# Patient Record
Sex: Female | Born: 1955
Health system: Southern US, Community
[De-identification: ages and names within clinical notes are randomized; demographics above are authoritative.]

## PROBLEM LIST (undated history)

## (undated) DIAGNOSIS — Q211 Atrial septal defect: Secondary | ICD-10-CM

## (undated) DIAGNOSIS — Z780 Asymptomatic menopausal state: Secondary | ICD-10-CM

## (undated) DIAGNOSIS — R112 Nausea with vomiting, unspecified: Secondary | ICD-10-CM

## (undated) DIAGNOSIS — N200 Calculus of kidney: Secondary | ICD-10-CM

## (undated) DIAGNOSIS — K5790 Diverticulosis of intestine, part unspecified, without perforation or abscess without bleeding: Secondary | ICD-10-CM

## (undated) DIAGNOSIS — Z9889 Other specified postprocedural states: Secondary | ICD-10-CM

## (undated) DIAGNOSIS — IMO0002 Reserved for concepts with insufficient information to code with codable children: Secondary | ICD-10-CM

## (undated) DIAGNOSIS — Q2112 Patent foramen ovale: Secondary | ICD-10-CM

## (undated) DIAGNOSIS — I639 Cerebral infarction, unspecified: Secondary | ICD-10-CM

## (undated) DIAGNOSIS — J302 Other seasonal allergic rhinitis: Secondary | ICD-10-CM

## (undated) DIAGNOSIS — I10 Essential (primary) hypertension: Secondary | ICD-10-CM

## (undated) DIAGNOSIS — C801 Malignant (primary) neoplasm, unspecified: Secondary | ICD-10-CM

## (undated) DIAGNOSIS — Z8669 Personal history of other diseases of the nervous system and sense organs: Secondary | ICD-10-CM

## (undated) HISTORY — PX: TOTAL ABDOMINAL HYSTERECTOMY: SHX209

## (undated) HISTORY — DX: Patent foramen ovale: Q21.12

## (undated) HISTORY — DX: Reserved for concepts with insufficient information to code with codable children: IMO0002

## (undated) HISTORY — DX: Malignant (primary) neoplasm, unspecified: C80.1

## (undated) HISTORY — DX: Cerebral infarction, unspecified: I63.9

## (undated) HISTORY — DX: Asymptomatic menopausal state: Z78.0

## (undated) HISTORY — DX: Other seasonal allergic rhinitis: J30.2

## (undated) HISTORY — DX: Atrial septal defect: Q21.1

## (undated) HISTORY — DX: Diverticulosis of intestine, part unspecified, without perforation or abscess without bleeding: K57.90

## (undated) HISTORY — DX: Personal history of other diseases of the nervous system and sense organs: Z86.69

## (undated) HISTORY — DX: Calculus of kidney: N20.0

---

## 1985-07-19 HISTORY — PX: BUNIONECTOMY: SHX129

## 1986-07-19 HISTORY — PX: TUBAL LIGATION: SHX77

## 1999-06-23 ENCOUNTER — Other Ambulatory Visit: Admission: RE | Admit: 1999-06-23 | Discharge: 1999-06-23 | Payer: Self-pay | Admitting: Obstetrics & Gynecology

## 1999-11-25 ENCOUNTER — Ambulatory Visit (HOSPITAL_COMMUNITY): Admission: RE | Admit: 1999-11-25 | Discharge: 1999-11-25 | Payer: Self-pay | Admitting: Neurology

## 1999-11-25 ENCOUNTER — Encounter: Payer: Self-pay | Admitting: Neurology

## 1999-12-03 ENCOUNTER — Ambulatory Visit: Admission: RE | Admit: 1999-12-03 | Discharge: 1999-12-03 | Payer: Self-pay | Admitting: Anesthesiology

## 2000-12-30 ENCOUNTER — Other Ambulatory Visit: Admission: RE | Admit: 2000-12-30 | Discharge: 2000-12-30 | Payer: Self-pay | Admitting: Obstetrics & Gynecology

## 2001-01-10 ENCOUNTER — Ambulatory Visit (HOSPITAL_COMMUNITY): Admission: RE | Admit: 2001-01-10 | Discharge: 2001-01-10 | Payer: Self-pay | Admitting: *Deleted

## 2001-01-10 ENCOUNTER — Encounter (INDEPENDENT_AMBULATORY_CARE_PROVIDER_SITE_OTHER): Payer: Self-pay | Admitting: *Deleted

## 2002-03-08 ENCOUNTER — Other Ambulatory Visit: Admission: RE | Admit: 2002-03-08 | Discharge: 2002-03-08 | Payer: Self-pay | Admitting: Otolaryngology

## 2003-11-19 ENCOUNTER — Other Ambulatory Visit: Admission: RE | Admit: 2003-11-19 | Discharge: 2003-11-19 | Payer: Self-pay | Admitting: Obstetrics & Gynecology

## 2005-08-19 DIAGNOSIS — IMO0002 Reserved for concepts with insufficient information to code with codable children: Secondary | ICD-10-CM

## 2005-08-19 DIAGNOSIS — R87619 Unspecified abnormal cytological findings in specimens from cervix uteri: Secondary | ICD-10-CM

## 2005-08-19 HISTORY — DX: Reserved for concepts with insufficient information to code with codable children: IMO0002

## 2005-08-19 HISTORY — DX: Unspecified abnormal cytological findings in specimens from cervix uteri: R87.619

## 2005-09-13 ENCOUNTER — Other Ambulatory Visit: Admission: RE | Admit: 2005-09-13 | Discharge: 2005-09-13 | Payer: Self-pay | Admitting: Obstetrics & Gynecology

## 2005-10-08 ENCOUNTER — Encounter (INDEPENDENT_AMBULATORY_CARE_PROVIDER_SITE_OTHER): Payer: Self-pay | Admitting: *Deleted

## 2005-10-08 ENCOUNTER — Inpatient Hospital Stay (HOSPITAL_COMMUNITY): Admission: AD | Admit: 2005-10-08 | Discharge: 2005-10-09 | Payer: Self-pay | Admitting: Obstetrics and Gynecology

## 2005-10-08 ENCOUNTER — Ambulatory Visit (HOSPITAL_COMMUNITY): Admission: RE | Admit: 2005-10-08 | Discharge: 2005-10-08 | Payer: Self-pay | Admitting: Obstetrics & Gynecology

## 2005-10-15 ENCOUNTER — Ambulatory Visit: Admission: RE | Admit: 2005-10-15 | Discharge: 2005-10-15 | Payer: Self-pay | Admitting: Gynecology

## 2005-12-07 ENCOUNTER — Ambulatory Visit: Admission: RE | Admit: 2005-12-07 | Discharge: 2005-12-07 | Payer: Self-pay | Admitting: Gynecology

## 2006-01-20 ENCOUNTER — Ambulatory Visit (HOSPITAL_COMMUNITY): Admission: RE | Admit: 2006-01-20 | Discharge: 2006-01-20 | Payer: Self-pay | Admitting: Gynecology

## 2006-02-22 ENCOUNTER — Encounter (INDEPENDENT_AMBULATORY_CARE_PROVIDER_SITE_OTHER): Payer: Self-pay | Admitting: *Deleted

## 2006-02-22 ENCOUNTER — Ambulatory Visit: Admission: RE | Admit: 2006-02-22 | Discharge: 2006-02-22 | Payer: Self-pay | Admitting: Gynecology

## 2006-02-22 ENCOUNTER — Other Ambulatory Visit: Admission: RE | Admit: 2006-02-22 | Discharge: 2006-02-22 | Payer: Self-pay | Admitting: Gynecology

## 2006-07-19 HISTORY — PX: OTHER SURGICAL HISTORY: SHX169

## 2006-09-16 ENCOUNTER — Encounter (INDEPENDENT_AMBULATORY_CARE_PROVIDER_SITE_OTHER): Payer: Self-pay | Admitting: Specialist

## 2006-09-16 ENCOUNTER — Ambulatory Visit: Admission: RE | Admit: 2006-09-16 | Discharge: 2006-09-16 | Payer: Self-pay | Admitting: Gynecology

## 2006-09-16 ENCOUNTER — Other Ambulatory Visit: Admission: RE | Admit: 2006-09-16 | Discharge: 2006-09-16 | Payer: Self-pay | Admitting: Gynecology

## 2007-02-16 ENCOUNTER — Encounter: Admission: RE | Admit: 2007-02-16 | Discharge: 2007-02-16 | Payer: Self-pay | Admitting: Family Medicine

## 2007-03-23 ENCOUNTER — Encounter: Admission: RE | Admit: 2007-03-23 | Discharge: 2007-03-23 | Payer: Self-pay | Admitting: Surgery

## 2007-03-23 ENCOUNTER — Ambulatory Visit (HOSPITAL_BASED_OUTPATIENT_CLINIC_OR_DEPARTMENT_OTHER): Admission: RE | Admit: 2007-03-23 | Discharge: 2007-03-23 | Payer: Self-pay | Admitting: Surgery

## 2007-03-23 ENCOUNTER — Encounter (INDEPENDENT_AMBULATORY_CARE_PROVIDER_SITE_OTHER): Payer: Self-pay | Admitting: Surgery

## 2007-04-07 ENCOUNTER — Other Ambulatory Visit: Admission: RE | Admit: 2007-04-07 | Discharge: 2007-04-07 | Payer: Self-pay | Admitting: Gynecology

## 2007-04-07 ENCOUNTER — Ambulatory Visit: Admission: RE | Admit: 2007-04-07 | Discharge: 2007-04-07 | Payer: Self-pay | Admitting: Gynecology

## 2007-04-07 ENCOUNTER — Encounter: Payer: Self-pay | Admitting: Gynecology

## 2007-04-27 ENCOUNTER — Encounter: Admission: RE | Admit: 2007-04-27 | Discharge: 2007-04-27 | Payer: Self-pay | Admitting: Gastroenterology

## 2007-11-22 ENCOUNTER — Encounter: Payer: Self-pay | Admitting: Gynecology

## 2007-11-22 ENCOUNTER — Ambulatory Visit: Admission: RE | Admit: 2007-11-22 | Discharge: 2007-11-22 | Payer: Self-pay | Admitting: Gynecology

## 2007-11-22 ENCOUNTER — Other Ambulatory Visit: Admission: RE | Admit: 2007-11-22 | Discharge: 2007-11-22 | Payer: Self-pay | Admitting: Gynecology

## 2007-12-28 ENCOUNTER — Ambulatory Visit: Payer: Self-pay | Admitting: Nurse Practitioner

## 2008-01-31 ENCOUNTER — Encounter: Admission: RE | Admit: 2008-01-31 | Discharge: 2008-01-31 | Payer: Self-pay | Admitting: Obstetrics & Gynecology

## 2008-02-28 HISTORY — PX: NM MYOCAR PERF WALL MOTION: HXRAD629

## 2008-03-21 HISTORY — PX: US ECHOCARDIOGRAPHY: HXRAD669

## 2008-04-11 ENCOUNTER — Ambulatory Visit: Payer: Self-pay | Admitting: Nurse Practitioner

## 2008-12-10 ENCOUNTER — Ambulatory Visit: Payer: Self-pay | Admitting: Nurse Practitioner

## 2008-12-13 ENCOUNTER — Encounter: Payer: Self-pay | Admitting: Gynecology

## 2008-12-13 ENCOUNTER — Other Ambulatory Visit: Admission: RE | Admit: 2008-12-13 | Discharge: 2008-12-13 | Payer: Self-pay | Admitting: Gynecology

## 2008-12-13 ENCOUNTER — Ambulatory Visit: Admission: RE | Admit: 2008-12-13 | Discharge: 2008-12-13 | Payer: Self-pay | Admitting: Gynecology

## 2009-01-31 ENCOUNTER — Encounter: Admission: RE | Admit: 2009-01-31 | Discharge: 2009-01-31 | Payer: Self-pay | Admitting: Obstetrics & Gynecology

## 2009-12-05 ENCOUNTER — Other Ambulatory Visit: Admission: RE | Admit: 2009-12-05 | Discharge: 2009-12-05 | Payer: Self-pay | Admitting: Gynecology

## 2009-12-05 ENCOUNTER — Ambulatory Visit: Admission: RE | Admit: 2009-12-05 | Discharge: 2009-12-05 | Payer: Self-pay | Admitting: Gynecology

## 2010-03-17 ENCOUNTER — Ambulatory Visit: Payer: Self-pay | Admitting: Nurse Practitioner

## 2010-04-12 ENCOUNTER — Encounter: Admission: RE | Admit: 2010-04-12 | Discharge: 2010-04-12 | Payer: Self-pay | Admitting: Orthopaedic Surgery

## 2010-07-19 DIAGNOSIS — N200 Calculus of kidney: Secondary | ICD-10-CM

## 2010-07-19 HISTORY — DX: Calculus of kidney: N20.0

## 2010-12-01 NOTE — Consult Note (Signed)
Raven [name], STROTHMAN NO.:  [id number]   MEDICAL RECORD NO.:  [id number]          PATIENT TYPE:  OUT   LOCATION:  GYN                          FACILITY:  [facility]   PHYSICIAN:  De Blanch, M.D.DATE OF BIRTH:  1955/11/09   DATE OF CONSULTATION:  12/13/2008  DATE OF DISCHARGE:                                 CONSULTATION   CHIEF COMPLAINT:  Carcinoma of the cervix.   INTERVAL HISTORY:  The patient returns today for continuing followup of  adenocarcinoma of the cervix treated with radical hysterectomy April  2007.  Since her last visit, she has done well.  She denies any GI/GU  symptoms.  Has no pelvic pain, pressure, vaginal bleeding or discharge.  It is noted that her last Pap smear showed a low-grade squamous  intraepithelial lesion.  She denies any vaginal bleeding.  She notes  that her bladder function is good and functional status is excellent.   HISTORY OF PRESENT ILLNESS:  April 2007, radical hysterectomy and pelvic  lymphadenectomy, and suprapubic catheter placement for a stage IB grade  1 adenocarcinoma of the cervix.  Final pathology showed no residual  disease.  The patient had 33 negative lymph nodes.  She did not receive  any adjuvant therapy.   PAST MEDICAL HISTORY/ILLNESSES:  1. Migraine headaches.  2. Acid reflux.  3. Diverticulosis.   PAST SURGICAL HISTORY:  1. Bilateral bunionectomy.  2. Tubal ligation.  3. Resection of precancerous lesions from the tongue.  4. Cold knife conization.  5. Radical hysterectomy and pelvic lymphadenectomy.  6. Breast biopsy.   DRUG ALLERGIES:  1. SULFA.  2. CODEINE.   CURRENT MEDICATIONS:  1. Nexium.  2. Imitrex.  3. Premarin 1.25 mg daily.  4. Zyrtec.  5. Topamax.  6. Calcium with vitamin D.   FAMILY HISTORY:  Negative for gynecologic, breast or colon cancer.   OBSTETRICAL HISTORY:  Gravida 3.   SOCIAL HISTORY:  The patient is married.  She does not drink.   REVIEW OF SYSTEMS:  A  10-point comprehensive review of systems is  negative except as noted above.   PHYSICAL EXAMINATION:  VITAL SIGNS:  Weight 170 pounds.  GENERAL:  The patient is a healthy white female in no acute distress.  HEENT:  Negative.  NECK:  Supple without thyromegaly.  There is no supraclavicular or  inguinal adenopathy.  ABDOMEN:  Soft, nontender.  No mass, organomegaly, ascites or hernias  noted.  Pfannenstiel incision is well healed.  PELVIC:  EGBUS, vagina, bladder and urethra are normal.  Cervix and  uterus are surgically absent.  Adnexa without masses.  Rectovaginal exam  confirms.  LOWER EXTREMITIES:  Without edema or varicosities.   IMPRESSION:  Stage IB1 adenocarcinoma of the cervix.  No evidence of  recurrent disease.  Pap smears were obtained.   PLAN:  The patient will return to see Dr. Arlyce Dice in 6 months.  Return to  see [place] in 1 year.      De Blanch, M.D.  Electronically Signed     DC/MEDQ  D:  12/13/2008  T:  12/13/2008  Job:  [id number]   cc:  Telford Nab, R.N.  [address]  [place], [place] [address]   Ilda Mori, M.D.  Fax: [phone]   Lucrezia Starch. Earlene Plater, M.D.  Fax: [phone]   Vale Haven. Andrey Campanile, M.D.  Fax: [phone]   Quita Skye. Artis Flock, M.D.  Fax: [phone]   Danise Edge, M.D.  Fax: [phone]

## 2010-12-01 NOTE — Assessment & Plan Note (Signed)
Raven [name], KIL NO.:  [id number]   MEDICAL RECORD NO.:  [id number]          PATIENT TYPE:  POB   LOCATION:  [facility] at [facility]         FACILITY:  [facility]   PHYSICIAN:  Elsie Lincoln, MD      DATE OF BIRTH:  09/25/1955   DATE OF SERVICE:  12/10/2008                                  CLINIC NOTE   The patient comes to office today for followup on her migraine  headaches.  The patient was last seen in September 2009.  Since then she  has been back to her cardiologist, who said that she was not a candidate  for the closure of her PFO and she has not had a stroke or heart attack  that she was not even considered to be a candidate.  She has been off  her Effexor for the last 2 months.  She did not feel that it was  helpful.  She does feel very well right now except for her allergies  which are very bad at this point.  She does feel that has a tendency to  make her headaches worse.  She has a lot of nasal discharge.  She is  looking forward to her daughter's wedding in November in [place].   PHYSICAL EXAMINATION:  VITAL SIGNS:  Blood pressure is 126/72, pulse is  68, weight is 171, and height is 5 feet 10.  GENERAL:  Well-developed, well-nourished 55 year old Caucasian female in  no acute distress.   ALLERGIES:  SULFA, CODEINE, and MORPHINE.   ASSESSMENT:  Migraine headache with allergies.   PLAN:  To get her allergies back under control.  We will give her  prednisone taper.  Prednisone 20 mg 4 tablets x1 day, 3 tablets x1, 2  tablets x1 day, and 1 tablet x1 day.  The patient is aware she needs to  take these with food.  She is also given a prescription for Nasacort  nasal spray 1 spray per nostril nightly, #1 with p.r.n. refills.  She  will hold on her Effexor for now if she decides that she needs to resume  that around her daughter's wedding she can certainly do that.  She is  asking for refill on her Imitrex 100 mg 1 p.o. p.r.n. migraine, #9  p.r.n.  refills as well as her Topamax 200 mg 1 p.o. nightly, #30 with  p.r.n. refills.  She will return to clinic in 3 months.      [name], NP    ______________________________  Elsie Lincoln, MD    LR/MEDQ  D:  12/10/2008  T:  12/11/2008  Job:  [id number]

## 2010-12-01 NOTE — Assessment & Plan Note (Signed)
NAME:  Raven [name], HOLLINGS NO.:  [id number]   MEDICAL RECORD NO.:  [id number]          PATIENT TYPE:  POB   LOCATION:  [facility] at [facility]         FACILITY:  [facility]   PHYSICIAN:  Tinnie Gens, MD        DATE OF BIRTH:  [birth year]   DATE OF SERVICE:  03/17/2010                                  CLINIC NOTE   The patient comes to office today for followup on her migraine  headaches.  The patient was seen last year, since then her daughter has  gotten married and is not expecting the baby.  She has done about the  same overall with her headaches.  She is continuing to go through  menopause following a complete radical hysterectomy.  She has had some  issues with her allergies in the spring and in the fall, but has been  taking Alavert the Nasacort.  Otherwise, things are the same for her.   PHYSICAL EXAMINATION:  VITAL SIGNS:  Blood pressure is 135/88, pulse is  64, weight 171, height is 5 feet 10 inches.  GENERAL:  A well-developed, well-nourished 55 year old Caucasian female  in no acute distress.  HEENT:  Head is normocephalic and atraumatic.  Pupils equal and react.  NEUROLOGIC:  The patient is alert and oriented.  She has good muscle  tone and coordination.  CARDIAC:  Regular rate and rhythm.  LUNGS:  Clear bilaterally.   ASSESSMENT:  Migraine headaches.   PLAN:  We will basically refill her medications today:  1. Nasacort nasal spray 2 sprays each nostril at bedtime #1 bottle      with p.r.n. refills.  2. Refill Topamax 200 mg 1 p.o. q.h.s. #30 with 11 refills.  3. Refill Imitrex 100 mg 1 p.o. p.r.n. migraine #9 with 11 refills.  4. Imitrex injectable 4 mg 1 injectable as needed for severe migraine      #2 with 11 refills.  5. Phenergan suppository 25 mg 1 per rectum q.6 hours p.r.n. nausea      #20 with  1 refill.  6. For rescue Vicodin ES 1 p.o. q.6 hours p.r.n. #30 with no refills.   The patient will return to this clinic in 1 year or sooner as  needed.      [name], NP    ______________________________  Tinnie Gens, MD    LR/MEDQ  D:  03/17/2010  T:  03/17/2010  Job:  [id number]

## 2010-12-01 NOTE — Assessment & Plan Note (Signed)
NAME:  Raven [name], COTO NO.:  [id number]   MEDICAL RECORD NO.:  [id number]          PATIENT TYPE:  POB   LOCATION:  [facility] at [facility]         FACILITY:  [facility]   PHYSICIAN:  Ginger Carne, MD DATE OF BIRTH:  Dec 12, 1955   DATE OF SERVICE:  04/11/2008                                  CLINIC NOTE   The patient comes to the office today for followup on her migraine  headaches.  The patient is doing extremely well with her headaches.  She  feels that the Effexor has been very helpful for her.  She in fact  pulled out her Imitrex package and has not had to have it refilled since  July and she was very pleased with that.  She has since her last office  visit had a full cardiac evaluation she was having, what she thought was  chest pain, which turned out to be pain from hernia, but she did have a  complete cardiac evaluation including stress test and echocardiogram.  She was discovered to have a PFO and there is some discussion about  having her PFO closed.  She is in the process of considering this.   VITAL SIGNS:  Blood pressure is 122/78, pulse is 70, weight is 167,  height is 5 feet 10.   ASSESSMENT:  Migraine without aura.   PLAN:  The patient will continue her medications as previously  prescribed.  She will follow up in 6 months.  She will return if she is  having any problems and we will certainly discuss her PFO at that time.      [name], NP    ______________________________  Ginger Carne, MD    LR/MEDQ  D:  04/11/2008  T:  04/11/2008  Job:  [id number]

## 2010-12-01 NOTE — Op Note (Signed)
[name], Raven [name]           ACCOUNT NO.:  [id number]   MEDICAL RECORD NO.:  [id number]          PATIENT TYPE:  AMB   LOCATION:  [facility]                          FACILITY:  [facility]   PHYSICIAN:  Wilmon Arms. Corliss Skains, M.D. DATE OF BIRTH:  [birth year]   DATE OF PROCEDURE:  03/23/2007  DATE OF DISCHARGE:                               OPERATIVE REPORT   PREOPERATIVE DIAGNOSIS:  Left breast mass.   POSTOPERATIVE DIAGNOSIS:  Left breast mass.   PROCEDURE PERFORMED:  Left breast needle-localized lumpectomy.   SURGEON:  Wilmon Arms. Tsuei, M.D.,FACS   ANESTHESIA:  General.   INDICATIONS:  The patient is a 55 year old female who recently underwent  a radical hysterectomy for cervical cancer.  In July 2008 she had a  routine screening mammogram.  Apparently a lesion was noted in the left  breast at 6 o'clock.  She was then referred to [facility]  for ultrasound performed February 13, 2007, showing a hypoechoic oval-shaped  mass measuring 7 mm x 5 mm x 6 mm.  This was at 6 o'clock in the left  breast, 3 cm below the nipple.  She then underwent an ultrasound-guided  core biopsy on February 27, 2007.  The pathology report showed that this  area did not have any malignancy but there was a fibroepithelial lesion  with a differential for possible phylloides tumor versus cellular  fibroadenoma.  The patient has then referred for surgical evaluation.  Unfortunately a biopsy clip was not placed at the time of core biopsy.   DESCRIPTION OF PROCEDURE:  The patient went to [facility] this morning and underwent a ultrasound-guided needle  localization.  The patient was then sent to [facility] day surgery.  She was  brought to the operating room, placed in the supine position on  operating table.  After adequate level of general anesthesia was  obtained, the patient's left breast was prepped with Betadine and draped  in sterile fashion.  Time-out was taken to assure proper patient,  proper  procedure.  The area around the wire and lower left breast was  infiltrated with 0.25% Marcaine.  Elliptical incision was made around  the wire which was entering laterally headed in a lateral to medial  direction.  The wire was grasped with an Allis clamp.  Skin flaps were  raised in all directions.  We excised a core of tissue around the wire  down to the chest wall.  We were well past the lesion at 6 o'clock.  The  specimen was amputated.  I could visualize the very tip of the wire at  the apex of the specimen.  The specimen was oriented with a long suture  lateral and short suture superior.  The cavity was then thoroughly  irrigated and inspected for hemostasis.  It was closed with a deep layer of 3-0 Vicryl and a subcuticular layer 4-  0 Monocryl.  Steri-Strips and clean dressings were applied.  The patient  was then extubated and brought to recovery in stable condition.  All  sponge, instrument, needle counts correct.      Wilmon Arms. Tsuei, M.D.  Electronically Signed     MKT/MEDQ  D:  03/23/2007  T:  03/23/2007  Job:  [id number]   cc:   Norva Pavlov, M.D.  Ilda Mori, M.D.

## 2010-12-01 NOTE — Assessment & Plan Note (Signed)
NAME:  Raven [name], Raven [name] NO.:  [id number]   MEDICAL RECORD NO.:  [id number]          PATIENT TYPE:  POB   LOCATION:  [facility] at [facility]         FACILITY:  [facility]   PHYSICIAN:  Allie Bossier, MD        DATE OF BIRTH:  01-11-1956   DATE OF SERVICE:                                  CLINIC NOTE   The patient is in the office today for a consultation for her headaches.  The patient is well known to [facility].  She  does have a longstanding history of migraines without aura since she was  an adolescent.  She is currently experiencing approximately 12 severe  headaches per month.  No real moderate and approximately daily mild  headache.  She feels that her mild headaches are tension headaches and  they are related to the stress that she is having at her job.  She is  currently having to let people off and that has caused her anxiety.  She  has also had some recent health problems including a complete radical  hysterectomy for cervical cancer and a partial mastectomy for non-  cancerous reasons.  She does admit that when she has a severe headache  that the Imitrex seems to be working well.  She is currently on Topamax  200 mg at bedtime for headache prevention and that does work well.  She  has tried to go up on that in the past with the doctors at [facility] and when that has happened, she has gotten severe leg  cramps.  She is sleeping well at night at this time.  She is currently  taking Premarin 1.55 mg for oral hormone replacement and she is doing  well with that.   ALLERGIES TO MEDICATIONS:  SULFA, CODEINE, and MORPHINE.   CURRENT MEDICATIONS:  1. Premarin 1.55 mg.  2. Topamax 200 mg.  3. Zyrtec 10 mg.  4. Nexium 40 mg.  5. Calcium and vitamin D.   OBSTETRICAL HISTORY:  She is G3 P3.   GYN HISTORY:  Her last Pap smear was in April of 2009.  She has had  abnormal in the past including cervical cancer for which she had  a  radical hysterectomy in 2008.  She also had a fiber adenoma with a  partial mastectomy in September 2009.   SURGICAL HISTORY:  She has had a tubal ligation, bunions radical  hysterectomy, and partial mastectomy.  She does have a family history of  heart disease, high blood pressure, and cancer.   PERSONAL MEDICAL HISTORY:  She has a history of kidney stones, breast  disease, and cervical cancer.   SOCIAL HISTORY:  She lives with her husband at home.  She works outside  of the home.  She does not drink or smoke and she drinks 1 or 2  caffeinated beverages per day.   REVIEW OF SYSTEMS:  The patient denies bruising, numbness, swelling,  muscle aches, fevers, or fatigue.  Admits to having frequent headaches.   PHYSICAL EXAMINATION:  VITAL SIGNS:  Temperature was not taken.  Pulse  was 66, blood pressure was 138/91, weight 171, height is 5  feet 10  inches.  VITAL SIGNS:  Well-developed, well-nourished 55 year old Caucasian  female, in no acute distress.  HEENT:  Head is normocephalic and atraumatic.  Eyes are acute equal and  react to light.  CARDIAC:  Regular rate and rhythm.  LUNGS:  Clear bilaterally.  NEUROLOGIC:  The patient is neurologically intact.  She is alert and  oriented.  She is well coordinated.  She has good recall.  She is clear  and articulate in her speech.  She has appropriate muscle strength.   ASSESSMENT AND PLAN:  Chronic migraine without aura.  1. She will had multiple vitamin to her vitamin regime.  She will also      increase her fluids and that may help her leg cramps with the      Topamax.  2. Refill her Imitrex 100 mg 1 p.r.n. migraine.  She gets  #18.  1. Topamax 200 mg 1 p.o. q.h.s.  2. We will add that baclofen 10 mg 1 p.o. t.i.d. p.r.n. tension      headache.  3. We will add Effexor XR 75 mg 1 q.a.m.  She will follow up in 3      months or sooner she has any difficulties.      [name], NP    ______________________________  Allie Bossier, MD    LR/MEDQ  D:  12/28/2007  T:  12/28/2007  Job:  [id number]

## 2010-12-01 NOTE — Consult Note (Signed)
NAMEJESSIKA, Raven [name] NO.:  [id number]   MEDICAL RECORD NO.:  [id number]          PATIENT TYPE:  OUT   LOCATION:  GYN                          FACILITY:  [facility]   PHYSICIAN:  De Blanch, M.D.DATE OF BIRTH:  09/10/1955   DATE OF CONSULTATION:  04/07/2007  DATE OF DISCHARGE:  04/07/2007                                 CONSULTATION   CHIEF COMPLAINT:  Lower abdominal pain.   INTERVAL HISTORY:  The patient returns today as scheduled for a routine  checkup although she has a number of new complaints.  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   More specific to today's visit, the patient complains of dull lower  abdominal pain which is worst after meals but is present now nearly all  of time.  She also has intermittent diarrhea and bloating.  She has  apparently been evaluated by her gastroenterologist, Dr. Laural Benes, who  has the patient scheduled for colonoscopy. She has been sent back to me  to consider as to whether this pain is associated with her prior  surgery.   The patient also gives a history of having bladder pain and claims to  have had cystoscopy with removal of a bladder stone. Records are not  available for our review.  The patient also claims to have a low grade  fever (99).   HISTORY OF PRESENT ILLNESS:  The patient underwent a radical  hysterectomy and pelvic lymphadenectomy in October 17, 2005 for a stage IB1  grade 1 adenocarcinoma of the cervix.  She had no residual disease on  final pathology and 33 lymph nodes were negative.   PAST MEDICAL HISTORY:  Medical illnesses:  Migraines treated with  Imitrex, acid reflux, diverticulosis.   PAST SURGICAL HISTORY:  Bilateral bunionectomy, tubal ligation,  resection of precancerous lesion from her tongue, cold knife conization,  radical hysterectomy and pelvic lymphadenectomy, breast biopsy.   DRUG ALLERGIES:   SULFA and CODEINE.   CURRENT MEDICATIONS:  Nexium, Imitrex Premarin 1.25 mg daily, Zyrtec,  Topamax and calcium with vitamin D.   FAMILY HISTORY:  Negative gynecologic, breast or colon cancer.   OBSTETRICAL HISTORY:  Gravida 3.   SOCIAL HISTORY:  The patient is married.  She does not drink.   REVIEW OF SYSTEMS:  A 10-point comprehensive review of systems negative  except for symptoms noted above.   PHYSICAL EXAM:  Weight 168 pounds, blood pressure of 120/70, pulse 80,  respiratory rate 20.  GENERAL:  The patient is a slightly anxious and possibly depressed white  female in no acute distress.  HEENT:  Negative.  NECK:  Supple without thyromegaly. There is no supraclavicular or  inguinal adenopathy.  ABDOMEN:  Soft and really nontender.  She has no pain to deep palpation  and there is no rebound.  No masses, organomegaly or ascites are noted.  PELVIC:  EGBUS, vagina, bladder and urethra are normal. Cervix and  uterus are surgically absent.  Adnexa without masses.  Rectovaginal exam  confirms.  Pap smears were obtained.  Palpation  of her bladder does not  reveal any particular pain.  LOWER EXTREMITIES:  Without edema or varicosities.   IMPRESSION:  1. Stage IB1 adenocarcinoma of the cervix, no evidence of recurrent      disease.  Pap smears are pending.  2. Abdominal pain of questionable origin.  Given the more recent onset      of this pain I do not see how it would be connected with her      surgery which was performed nearly a year and half ago.  I did tell      her that certainly adhesions could be present but that I doubted      that adhesions would start causing pain in the abdomen well over a      year following surgery.  Given the association of the pain with      some meals and her diarrhea and recent use of antibiotics for      urinary tract infection, I think it reasonable to continue workup      of her GI tract and she is previously scheduled to see Dr. Laural Benes       in the near future.  For now we will have her to return to see Dr.      Arlyce Dice in 6 months return to see [place] in 1 year.      De Blanch, M.D.  Electronically Signed     DC/MEDQ  D:  04/09/2007  T:  04/10/2007  Job:  [id number]   cc:   Telford Nab, R.N.  [address]  [place], [place] [address]   Ilda Mori, M.D.  Fax: [phone]   Lucrezia Starch. Earlene Plater, M.D.  Fax: [phone]   Vale Haven. Andrey Campanile, M.D.  Fax: [phone]   Quita Skye. Artis Flock, M.D.  Fax: [phone]   Danise Edge, M.D.  Fax: [phone]

## 2010-12-01 NOTE — Consult Note (Signed)
[name], [name] NO.:  [id number]   MEDICAL RECORD NO.:  [id number]          PATIENT TYPE:  OUT   LOCATION:  GYN                          FACILITY:  [facility]   PHYSICIAN:  De Blanch, M.D.DATE OF BIRTH:  02-May-1956   DATE OF CONSULTATION:  11/22/2007  DATE OF DISCHARGE:                                 CONSULTATION   CHIEF COMPLAINT:  Adenocarcinoma of the cervix.   INTERVAL HISTORY:  Since her last visit, the patient has done well.  She  denies any GI or GU symptoms.  Has no pelvic pain, pressure, vaginal  bleeding, or discharge.  Functional status has been excellent.  She saw  Dr. Arlyce Dice approximately six months ago.   HISTORY OF PRESENT ILLNESS:  Patient underwent a radical hysterectomy  and pelvic lymphadenectomy in April, 2007.  She had a stage IB, grade 1  adenocarcinoma of the cervix.  Final pathology showed no residual  disease, and 33 lymph nodes were negative.  The patient did not receive  any adjuvant therapy.   PAST MEDICAL HISTORY/MEDICAL ILLNESSES:  1. Migraines, treated with Imitrex.  2. Acid reflux.  3. Diverticulosis.   PAST SURGICAL HISTORY:  1. Bilateral bunionectomy.  2. Tubal ligation.  3. Resection of pre-cancerous lesions from her tongue.  4. Cold knife conization.  5. Radical hysterectomy.  6. Pelvic lymphadenectomy.  7. Breast biopsy.   DRUG ALLERGIES:  SULFA, CODEINE.   CURRENT MEDICATIONS:  1. Nexium.  2. Imitrex.  3. Premarin 1.25 mg daily plus 0.3 mg daily.  4. Zyrtec.  5. Topamax.  6. Calcium with vitamin D.   FAMILY HISTORY:  Negative for gynecologic, breast, or colon cancer.   OBSTETRICAL HISTORY:  Gravida 3.   SOCIAL HISTORY:  Patient is married.  She does not drink.   REVIEW OF SYSTEMS:  A 10-point comprehensive review of systems is  negative, except as noted above.   PHYSICAL EXAMINATION:  Weight 167 pounds (stable).  GENERAL:  Patient is a healthy white female in no acute distress.  HEENT:   Negative.  NECK:  Supple without thyromegaly.  There is no supraclavicular or  inguinal adenopathy.  ABDOMEN:  Soft and nontender.  No masses, organomegaly, ascites, or  hernias are noted.  PELVIC:  EG/BUS, vagina, bladder, and urethra are normal.  The cervix  and uterus are surgically absent.  Adnexa without masses.  Rectovaginal  exam confirms.   IMPRESSION:  Stage IB1 adenocarcinoma of the cervix.  No evidence for  recurrent disease.   PLAN:  Pap smears are obtained.  Patient is given a new prescription for  Premarin.  She will return to see Dr. Arlyce Dice in six months and return to  see [place] in one year.      De Blanch, M.D.  Electronically Signed     DC/MEDQ  D:  11/22/2007  T:  11/22/2007  Job:  [id number]   cc:   Telford Nab, R.N.  [address]  [place], [place] [address]   Ilda Mori, M.D.  Fax: [phone]   Lucrezia Starch. Earlene Plater, M.D.  Fax: [phone]   Vale Haven. Andrey Campanile, M.D.  Fax:  [phone]   Quita Skye. Artis Flock, M.D.  Fax: [phone]   Danise Edge, M.D.  Fax: [phone]

## 2010-12-04 NOTE — Consult Note (Signed)
Raven [name], Raven [name] NO.:  [id number]   MEDICAL RECORD NO.:  [id number]          PATIENT TYPE:  OUT   LOCATION:  GYN                          FACILITY:  [facility]   PHYSICIAN:  De Blanch, M.D.DATE OF BIRTH:  1956-03-07   DATE OF CONSULTATION:  10/15/2005  DATE OF DISCHARGE:  10/15/2005                                   CONSULTATION   GYN ONCOLOGY CLINIC:   CHIEF COMPLAINT:  Adenocarcinoma of the cervix.   HISTORY OF PRESENT ILLNESS:  A 55 year old white married female seen in  consultation at the request of Dr. Ilda Mori regarding management of a  newly-diagnosed adenocarcinoma of the cervix with villoglandular features.  The patient initially presented to Dr. Arlyce Dice with heavy vaginal bleeding of  approximately 3 weeks duration.  The evaluation included an endometrial  biopsy and removal of a cervical polyp.  The polyp revealed adenocarcinoma.  Subsequently, the patient underwent a cold knife conization.  The conization  showed an adenocarcinoma with villoglandular features with a depth of 4 mm  of invasion, but with 1.3 cm in lateral extent.  She had associated  adenocarcinoma in situ which extended to the endocervical margins.   The patient has had an uncomplicated course following conization, except for  the fact that she had a considerable amount of nausea and vomiting following  anesthesia, requiring readmission for IV hydration.   PAST MEDICAL HISTORY:   MEDICAL ILLNESSES:  1.  Migraine headaches, treated with Imitrex.  2.  Acid reflux.   PAST SURGICAL HISTORY:  1.  Bilateral bunionectomy.  2.  Tubal ligation.  3.  Resection of a precancerous lesion on the tongue.  4.  Cold knife conization.   DRUG ALLERGIES:  1.  SULFA causes a rash.  2.  CODEINE causes nausea.   CURRENT MEDICATIONS:  1.  Nexium 40 mg daily.  2.  Imitrex p.r.n. but on approximately a weekly basis.   FAMILY HISTORY:  Negative for gynecologic, breast or colon  cancer.   OBSTETRICAL HISTORY:  Gravida 3.   SOCIAL HISTORY:  The patient is married.  She does not smoke or drink.   REVIEW OF SYSTEMS:  A 10-point comprehensive review of systems is negative  except as noted above.   PHYSICAL EXAMINATION:  VITAL SIGNS:  Height 5 feet, 10 inches, weight 170  pounds.  GENERAL:  The patient is a healthy white female in no acute distress.  HEENT:  Negative.  NECK:  Supple without thyromegaly.  There is no supraclavicular or inguinal  adenopathy.  ABDOMEN:  Soft, nontender.  No mass, organomegaly, ascites or hernias are  noted.  PELVIC:  EGBUS, vaginal, bladder and urethra are normal.  Cervix is healing  well following conization.  The uterus is anterior and normal in shape, size  and consistency.  There are no adnexal masses noted.  RECTOVAGINAL:  Confirms.  There is no evidence of any parametrial  involvement, and there is no tenderness.   IMPRESSION:  Stage IBI adenocarcinoma of the cervix.  We had a lengthy  discussion regarding the pros and cons of radiation therapy versus radical  hysterectomy with  pelvic lymphadenectomy.  We discussed the fact that both  result in equally good cure rates, and we discussed the potential for  serious complications from either one of these treatments.  At the  conclusion of the discussion, the patient wishes to go ahead with surgery as  soon as possible.  She is eager to get the surgery done so that she can  attend her daughter's high school graduation.  In order to accommodate her  schedule, we will schedule the surgery to be performed at the [facility], and it is scheduled for November 02, 2005.  The risks of surgery including hemorrhage, infection, injury to adjacent  viscera, thromboembolic complications, anesthetic risks were discussed.  In  addition, we did discuss the bladder dysfunction associated in some patients  following radical hysterectomy, and the patient accepts  these risks.  We  will go ahead and schedule surgery, and she will have a preoperative visit  at [facility] prior to surgery.      De Blanch, M.D.  Electronically Signed     DC/MEDQ  D:  10/15/2005  T:  10/18/2005  Job:  [id number]   cc:   Ilda Mori, M.D.  Fax: [phone]   Vale Haven. Andrey Campanile, M.D.  Fax: [phone]   Telford Nab, R.N.  [address]  [place], [place] [address]

## 2010-12-04 NOTE — Op Note (Signed)
Raven [name], Raven [name] NO.:  [id number]   MEDICAL RECORD NO.:  [id number]          PATIENT TYPE:  AMB   LOCATION:  SDC                           FACILITY:  [facility]   PHYSICIAN:  Ilda Mori, M.D.   DATE OF BIRTH:  04/03/1956   DATE OF PROCEDURE:  10/08/2005  DATE OF DISCHARGE:                                 OPERATIVE REPORT   PREOPERATIVE DIAGNOSIS:  Villoglandular adenocarcinoma of the cervix.   POSTOPERATIVE DIAGNOSIS:  Villoglandular adenocarcinoma of the cervix.   PROCEDURE:  Cold knife cone biopsy of the cervix.   SURGEON:  Dr. Ilda Mori.   ANESTHESIA:  General.   SPECIMENS:  Cervical cone biopsy.   ESTIMATED BLOOD LOSS:  Was 50 mL.   FINDINGS:  Were normal appearing cervix and no visible lesions seen.   INDICATIONS:  This is a 55 year old gravida 2, para 2 who was noted to have  an abnormal Pap smear approximately 3 weeks prior to admission and an office  endometrial aspiration revealed a small cervical polyp which on biopsy  revealed a villoglandular adenocarcinoma. Consultation was obtained with  De Blanch, M.D., GYN oncologist, and decision was made to  proceed with cold knife cone biopsy of the cervix prior to deciding on the  appropriate therapy.   PROCEDURE:  The patient is taken to the operating room and placed in the  dorsal supine position. General anesthesia was induced. The perineum and  vagina were prepped and draped in a sterile fashion. The cervix was  infiltrated with 0.5% Marcaine with 1:200,000 epinephrine. A cerclage stitch  was placed around the cervix and remained loose. The traction at 3 and 9  o'clock on the cerclage surface helped elevate the cervix which was then  coned using a #11 blade. The cone specimen was then removed. The cerclage  suture was then tightened with good hemostasis. The cone bed was painted  with Monsel's solution. Procedure was then terminated. The patient left the  operating room in good  condition.      Ilda Mori, M.D.  Electronically Signed     RK/MEDQ  D:  10/08/2005  T:  10/11/2005  Job:  [id number]

## 2010-12-04 NOTE — Consult Note (Signed)
Raven [name], Raven [name] NO.:  [id number]   MEDICAL RECORD NO.:  [id number]          PATIENT TYPE:  OUT   LOCATION:  GYN                          FACILITY:  [facility]   PHYSICIAN:  De Blanch, M.D.DATE OF BIRTH:  01-09-1956   DATE OF CONSULTATION:  02/22/2006  DATE OF DISCHARGE:                                   CONSULTATION   GYN ONCOLOGY CLINIC:   CHIEF COMPLAINT:  Cervical cancer.   INTERVAL HISTORY:  Since her last visit, the patient has done well.  She has  returned to full levels of activity, and has returned to work.  She has also  been able to go to the beach and the lake recently on vacation.  Her only  complaint is that of incontinence which sounds as though it is stress  incontinence, resulting from coughing, laughing and sneezing.  She also  notes decreased sensation in her  bladder since her radical hysterectomy.  She denies any vaginal bleeding.  Because of urinary incontinence, she was  evaluated with a tampon and AZO taken orally.  The tampon did not stain,  reassuring [place] that there is little likelihood that she has a vesicovaginal  fistula.  In direct questioning, the patient does not leak urine at night or  other times during the day.   HISTORY OF PRESENT ILLNESS:  The patient underwent a radical hysterectomy  and pelvic lymphadenectomy November 02, 2005.  She had a stage I-B, grade 1  adenocarcinoma of the cervix.  Final pathology showed no residual disease,  and 33 negative lymph nodes, and therefore the patient did not receive any  adjuvant therapy.   PAST MEDICAL HISTORY:  Migraines, treated with Imitrex, acid reflux.   PAST SURGICAL HISTORY:  Bilateral bunionectomy, tubal ligation, resection of  precancerous lesion from her tongue, cold knife conization, radical  hysterectomy, and pelvic lymphadenectomy.   DRUG ALLERGIES:  SULFA AND CODEINE.   CURRENT MEDICATIONS:  Nexium and Imitrex p.r.n.   FAMILY HISTORY:  Negative for  gynecologic, breast or colon cancer.   OBSTETRICAL HISTORY:  Gravida 3.   SOCIAL HISTORY:  The patient is married.  She does not smoke or drink.   REVIEW OF SYSTEMS:  A 10-point comprehensive review of systems is negative  except as noted above.   PHYSICAL EXAMINATION:  Weight 168 pounds, blood pressure 100/70, pulse 80,  respiratory rate 20.  GENERAL:  The patient is a healthy white female in no acute distress.  HEENT:  Negative.  NECK:  Supple without thyromegaly.  There is no supraclavicular or inguinal adenopathy.  ABDOMEN:  Soft, nontender, no mass, organomegaly, incisal hernia is noted.  Her Pfannenstiel incision and suprapubic catheter sites are well healed.  PELVIC:  EGBUS.  Vaginal, bladder and urethra are normal.  She does have a  modest cystocele.  No lesions noted.  Pap smears are obtained.  She does  have a small amount of granulation tissue at the apex which is touched with  silver nitrate.  Lower extremities without edema or varicosities.   IMPRESSION:  1. Stage I-B-1 adenocarcinoma of the cervix.  No evidence of disease.  2. Urinary incontinence.  We will refer the patient to Dr. Darvin Neighbours for      further evaluation.  Given her anatomic defect with the cystocele and      urethrocele, may well be she could be helped with a sling procedure.  I      think she should undergo full evaluation given the fact that she had a      radical hysterectomy, to make certain this is not a neurogenic or      hypotonic bladder situation.   The patient will return to see Dr. Shawn Stall in three months, and return  to see [place] in six months.      De Blanch, M.D.  Electronically Signed     DC/MEDQ  D:  02/22/2006  T:  02/22/2006  Job:  [id number]   cc:   Ilda Mori, M.D.  Fax: [phone]   Lucrezia Starch. Earlene Plater, M.D.  Fax: [phone]   Vale Haven. Andrey Campanile, M.D.  Fax: [phone]   Telford Nab, R.N.  [address]  [place], [place] [address]

## 2010-12-04 NOTE — Procedures (Signed)
[facility]  Patient:    Raven [name], Raven [name]                  MRN: [id number] Proc. Date: 12/03/99 Adm. Date:  [id number] Disc. Date: [id number] Attending:  Thyra Breed CC:         Dr. Clarisse Gouge                           Procedure Report  PROCEDURE:  Lumbar epidural blood patch.  DIAGNOSIS:  Posterior puncture headache.  HISTORY:  The patient is a 55 year old who was sent to [place] by Dr. Clarisse Gouge for an epidural blood patch.  She underwent a lumbar puncture by Dr. Pecolia Ades on May , 2001, and has developed a headache which is associated with nausea.  It is most  pronounced when she sits up for any period of time.  When she lays flat, she has minimal discomfort.  PHYSICAL EXAMINATION:  VITAL SIGNS:  Blood pressure 118/69.  Heart rate 73.  Respiratory rate 14.  O2 saturations 99%.  Pain level 7/10.  Temperature 97.7.  GENERAL:  The patient is in good spirits.  She does have a headache when she sits up.  NEUROLOGIC:  Grossly intact.  DESCRIPTION OF PROCEDURE:  After informed consent was obtained, the patient was  placed in the sitting position.  Her back was prepped with Betadine x 3.  her left antecubital fossa was prepped with Betadine x 3.  A skin wheal was raised at the L4-5 interspace with 1% lidocaine.  An 18 gauge Hustad needle was introduced to the lumbar epidural space with loss of resistance to preservative free normal saline. There was no CSF or blood.  Then, 15 ml of blood was obtained under sterile conditions from the left antecubital fossa.  This was injected into the epidural space and the needle flushed with 1 cc of preservative free normal saline.  The  needle was removed intact.  POST PROCEDURE CONDITION:  Stable with some reduction in her headache.  DISPOSITION: 1. The patient was encouraged to consume larger volumes of fluids, up to 4-6 L er    day for the next 2-3 days. 2. Continue on current medications. 3. The patient  was encouraged to follow up with Dr. Clarisse Gouge. DD:  12/03/99 TD:  12/08/99 Job: [id number]

## 2010-12-04 NOTE — Consult Note (Signed)
Raven [name], Raven [name] NO.:  [id number]   MEDICAL RECORD NO.:  [id number]          PATIENT TYPE:  OUT   LOCATION:  GYN                          FACILITY:  [facility]   PHYSICIAN:  De Blanch, M.D.DATE OF BIRTH:  [birth year]   DATE OF CONSULTATION:  12/07/2005  DATE OF DISCHARGE:                                   CONSULTATION   GYNECOLOGY/ONCOLOGY CLINIC   CHIEF COMPLAINT:  Cervical cancer.   INTERVAL HISTORY:  The patient returns today for postoperative followup  having had a radical hysterectomy at [facility] on November 10, 2005.  The  patient's postoperative course was complicated by severe nausea and vomiting  with partial small bowel obstruction.  These were all managed conservatively  in the patient's recovery.  At the present time, she is doing well.  She has  no GI/GU symptoms except for some urinary frequency, but no dysuria.  She  denies any fever or flank pain.  She reports that her bladder is emptying  well.   HISTORY OF PRESENT ILLNESS:  The patient was found to have an adenocarcinoma  of the cervix with 4 mm of invasion and 1.3 cm of lateral extent on  conization.  She had a radical hysterectomy and pelvic lymphadenectomy.  There was no residual invasive cancer in the cervix and 33 lymph nodes were  negative.   PAST MEDICAL HISTORY:  1.  Migraine headaches treated with Imitrex.  2.  Acid reflux.   PAST SURGICAL HISTORY:  1.  Bilateral bunionectomy.  2.  Tubal ligation.  3.  Resection of precancerous lesion from the tongue.  4.  Cold-knife conization.  5.  Radical hysterectomy in 2007.   ALLERGIES:  SULFA and CODEINE.   CURRENT MEDICATIONS:  1.  Nexium.  2.  Imitrex p.r.n.   FAMILY HISTORY:  Negative for gynecologic, breast or colon cancer.   PAST OBSTETRICAL HISTORY:  G3.   SOCIAL HISTORY:  The patient is married.  She does not smoke or drink.   REVIEW OF SYSTEMS:  A 10-point comprehensive review of systems negative  except  as noted above.   PHYSICAL EXAMINATION:  VITAL SIGNS:  Weight 170 pounds.  GENERAL:  The patient is a healthy, white female in no acute distress.  HEENT:  Negative.  NECK:  Supple without thyromegaly.  No supraclavicular or inguinal  adenopathy.  ABDOMEN:  Soft, nontender, no masses, organomegaly, ascites or hernias  noted.  Pfannenstiel incision is healing well.  PELVIC:  EG/BUS, vagina, bladder, urethra are normal.  Cervix and uterus are  surgically absent.  Vaginal cuff is healing well.  Bimanual and rectovaginal  exam reveals some postoperative induration at the vaginal cuff.  No masses  or tenderness are noted.  Lower extremities without edema or varicosities.   IMPRESSION:  Stage 1 adenocarcinoma of the cervix, status post radical  hysterectomy with no residual disease in the cervix after conization.   PLAN:  I believe the patient is at low risk and would not benefit from any  adjuvant treatment.  She will return to see me in 3 months and then we will  begin alternating  visits with her primary gynecologist, Dr. Arlyce Dice.      De Blanch, M.D.  Electronically Signed     DC/MEDQ  D:  12/07/2005  T:  12/07/2005  Job:  [id number]   cc:   Ilda Mori, M.D.  Fax: [phone]   Vale Haven. Andrey Campanile, M.D.  Fax: [phone]   Telford Nab, R.N.  [address]  [place], [place] [address]

## 2010-12-04 NOTE — Consult Note (Signed)
NAMECOCO, SHARPNACK NO.:  [id number]   MEDICAL RECORD NO.:  [id number]          PATIENT TYPE:  OUT   LOCATION:  GYN                          FACILITY:  [facility]   PHYSICIAN:  De Blanch, M.D.DATE OF BIRTH:  1956-05-10   DATE OF CONSULTATION:  09/16/2006  DATE OF DISCHARGE:                                 CONSULTATION   CHIEF COMPLAINT:  Cervical cancer.   INTERVAL HISTORY:  Since her last visit, the patient has done well.  She  did see Dr. Gaynelle Arabian for evaluation of urinary incontinence and  frequency but it was elected to continue to follow the patient without  any intervention.  She currently has some urinary frequency but no  dysuria, denies any flank pain or any fever or chills.  Otherwise she  has no pelvic pain, pressure or GI or GU symptoms.   HISTORY OF PRESENT ILLNESS:  The patient underwent a radical  hysterectomy and pelvic lymphadenectomy April 2007 for a stage IB grade  1 adenocarcinoma of the cervix.  Final pathology showed no residual  disease in 33 lymph nodes.   PAST MEDICAL HISTORY:  Medical illnesses:  Migraines treated with  Imitrex, acid reflux.   PAST SURGICAL HISTORY:  Bilateral bunionectomy, tubal ligation,  resection of precancerous lesion from her tongue, cold knife conization,  radical hysterectomy and pelvic lymphadenectomy.   DRUG ALLERGIES:  SULFA and CODEINE.   CURRENT MEDICATIONS:  Nexium, Imitrex, Premarin 1.25 mg daily.   FAMILY HISTORY:  Negative for gynecologic, breast or colon cancer.   OBSTETRICAL HISTORY:  Gravida 3.   SOCIAL HISTORY:  The patient is married.  She does not drink.   REVIEW OF SYSTEMS:  A 10-point comprehensive review of systems performed  and is negative except as noted above.  It is noted that the patient is  having hot flashes and night sweats and is not happy with those  symptoms.   PHYSICAL EXAM:  VITAL SIGNS:  Weight 170 pounds, blood pressure 105/72.  GENERAL:  The patient  is a healthy white female in no acute  distress.  HEENT:  Negative.  NECK:  Supple without thyromegaly. There is no supraclavicular or  inguinal adenopathy.  ABDOMEN:  Soft, nontender.  No mass, organomegaly, ascites or hernias  are noted.  PELVIC:  EGBUS, vagina, bladder, urethra are normal.  Cervix and uterus  surgically absent.  Adnexa without masses.  Rectovaginal exam confirms.  LOWER EXTREMITIES:  Without edema or varicosities.   IMPRESSION:  Stage IB1 adenocarcinoma of the cervix, no evidence of  recurrent disease.  Pap smears were obtained.   Hot flashes in menopausal patient. She will continue taking Premarin  1.25 mg daily and we will add another dose of Premarin 0.3 mg to see if  this will supplement her and relieve her hot flashes.  Given her urinary  tract symptoms, we have given her a prescription for Cipro. We will also  obtain a urine culture.  She will return to see Dr. Arlyce Dice in 4 months  and return to see [place] in 8 months.      De Blanch, M.D.  Electronically  Signed     DC/MEDQ  D:  09/16/2006  T:  09/16/2006  Job:  [id number]   cc:   Ilda Mori, M.D.  Fax: [phone]   Lucrezia Starch. Earlene Plater, M.D.  Fax: [phone]   Vale Haven. Andrey Campanile, M.D.  Fax: [phone]   Telford Nab, R.N.  [address]  [place], [place] [address]

## 2010-12-04 NOTE — Op Note (Signed)
[place]  Patient:    Raven [name], Raven [name]                    MRN: [id number] Proc. Date: 01/10/01 Adm. Date:  01/10/01 Attending:  Verlin Grills, M.D. CC:         Vale Haven. Andrey Campanile, M.D.   Operative Report  PROCEDURE:  Esophagogastroduodenoscopy, small bowel biopsies, and proctocolonoscopy report.  ENDOSCOPIST:  Verlin Grills, M.D.  REFERRING PHYSICIAN:  Duffy Rhody C. Andrey Campanile, M.D.  INDICATIONS:  [name] (Date of Birth:  05/07/1956) has iron deficiency anemia.  [name] underwent an esophagogastroduodenoscopy Dcember 31, 1998, at [facility] which revealed a 2 mm x 2 mm shallow prepyloric gastric ulcer which was negative for H. pylori antral gastritis.  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  She has normal menses.  She denies abdominal pain, diarrhea, or gastrointestinal bleeding.  To control heartburn [name] takes Nexium, on a daily basis.  [name] is undergoing an esophagogastroduodenoscopy with small bowel biopsies to rule out erosive esophagitis, peptic ulcer disease, and celiac sprue.  She is undergoing proctocolonoscopy to the cecum to rule out colonic neoplasia for which she is at low risk.  ENDOSCOPIST:  Verlin Grills, M.D.  PREMEDICATIONS:  Versed 10 mg, Fentanyl 50 mcg.  ENDOSCOPE:  Olympus pediatric colonoscope.  PROCEDURE:  Esophagogastroduodenoscopy with small bowel biopsies using the pediatric colonoscope.  After obtaining informed consent Ms. Skilton was placed in the left lateral decubitus position.  I administered intravenous fentanyl and intravenous Versed to achieve conscious sedation for the procedure.  The patients blood pressure, oxygen saturation, and cardiac rhythm were monitored throughout the procedure and documented in the medical record.  The Olympus Gastroscope was passed through the posterior hypopharynx  into the proximal esophagus without difficulty.  The hypopharynx, larynx and vocal cords appeared normal.  ESOPHAGOSCOPY:  The proximal, mid, and lower segments of the esophagus appeared normal.  Endoscopically there is no evidence for the presence of erosive esophagitis, Barretts esophagus, esophageal ulceration or esophageal mucosal scarring.  GASTROSCOPY:  Retroflexed view of the gastric cardia and fundus was normal. The gastric body, antrum, and pylorus appeared normal.  DUODENOSCOPY:  The duodenal bulb, mid duodenum, distal duodenum, and proximal jejunum appeared normal.  Five biopsies were taken from the second - third portions of the duodenum to rule out celiac sprue.  ASSESSMENT:  There were scattered prepyloric antral erosions without frank ulcers in the stomach; otherwise normal esophagogastroduodenoscopy with small bowel biopsies pending.  PROCTOCOLONOSCOPY TO THE CECUM:  Anal inspection was normal.  Digital rectal exam was normal.  The Olympus Pediatric Video Colonoscopy was introduced into the rectum and easily advanced to the cecum.  A normal appearing ileocecal valve was intubated and the distal ileum inspected.  Colonic preparation for the exam today was excellent.  RECTUM:  Normal.  SIGMOID COLON AND DESCENDING COLON:  Extensive left colonic diverticulosis.  SPLENIC FLEXURE:  Normal.  TRANSVERSE COLON:  Normal.  HEPATIC FLEXURE:  Normal.  ASCENDING COLON:  Normal.  CECUM AND ILEOCECAL VALVE:  Normal.  DISTAL ILEUM:  Normal.  ASSESSMENT:  Normal proctocolonoscopy to the cecum with intubation of the ileocecal valve and distal ileal inspection. DD:  01/10/01 TD:  01/10/01 Job: [id number]

## 2011-04-30 LAB — BASIC METABOLIC PANEL
BUN: 10
Chloride: 111
Glucose, Bld: 105 — ABNORMAL HIGH
Potassium: 3.3 — ABNORMAL LOW

## 2011-04-30 LAB — CBC
HCT: 40.5
MCV: 89.9
Platelets: 214
WBC: 6.4

## 2011-04-30 LAB — DIFFERENTIAL
Eosinophils Absolute: 0.1
Eosinophils Relative: 2
Lymphs Abs: 1.6
Monocytes Absolute: 0.4

## 2011-05-11 ENCOUNTER — Encounter: Payer: Self-pay | Admitting: Nurse Practitioner

## 2011-05-11 ENCOUNTER — Ambulatory Visit (INDEPENDENT_AMBULATORY_CARE_PROVIDER_SITE_OTHER): Payer: PRIVATE HEALTH INSURANCE | Admitting: Nurse Practitioner

## 2011-05-11 DIAGNOSIS — C539 Malignant neoplasm of cervix uteri, unspecified: Secondary | ICD-10-CM

## 2011-05-11 DIAGNOSIS — G43909 Migraine, unspecified, not intractable, without status migrainosus: Secondary | ICD-10-CM | POA: Insufficient documentation

## 2011-05-11 DIAGNOSIS — G43009 Migraine without aura, not intractable, without status migrainosus: Secondary | ICD-10-CM

## 2011-05-11 DIAGNOSIS — C50919 Malignant neoplasm of unspecified site of unspecified female breast: Secondary | ICD-10-CM

## 2011-05-11 MED ORDER — TOPIRAMATE 200 MG PO TABS: 200.0000 mg | ORAL_TABLET | ORAL | Status: DC

## 2011-05-11 MED ORDER — SUMATRIPTAN SUCCINATE 100 MG PO TABS: 100.0000 mg | ORAL_TABLET | ORAL | Status: DC | PRN

## 2011-05-11 MED ORDER — PROMETHAZINE HCL 25 MG RE SUPP: 25.0000 mg | Freq: Four times a day (QID) | RECTAL | Status: DC | PRN

## 2011-05-11 NOTE — Progress Notes (Signed)
S:  Pt is here today for follow up visit for her migraine headaches. She is doing well in general until allergy season starts. Then she can have an almost daily headache. She remains on Topamax for prevention and imitrex when she gets a headache. Socially her daughter has had a baby boy named Sam and the family is doing well.   O: Alert, orientated, NAD HEENT Negative Neuro: Negative Skin warm and dry  A: Migraine without aura  P: We will refill meds today including phenergan, topamax and phenergan. She will RTC one year or prn

## 2011-07-23 ENCOUNTER — Other Ambulatory Visit: Payer: Self-pay | Admitting: Obstetrics & Gynecology

## 2011-07-23 DIAGNOSIS — Z1231 Encounter for screening mammogram for malignant neoplasm of breast: Secondary | ICD-10-CM

## 2011-08-05 ENCOUNTER — Ambulatory Visit
Admission: RE | Admit: 2011-08-05 | Discharge: 2011-08-05 | Disposition: A | Discharge status: Home / Self Care | Payer: PRIVATE HEALTH INSURANCE | Source: Ambulatory Visit | Attending: Obstetrics & Gynecology | Admitting: Obstetrics & Gynecology

## 2011-08-05 DIAGNOSIS — Z1231 Encounter for screening mammogram for malignant neoplasm of breast: Secondary | ICD-10-CM

## 2012-01-14 ENCOUNTER — Other Ambulatory Visit: Payer: Self-pay | Admitting: *Deleted

## 2012-01-14 MED ORDER — TOPIRAMATE 200 MG PO TABS: 200.0000 mg | ORAL_TABLET | ORAL | Status: DC

## 2012-01-14 NOTE — Telephone Encounter (Signed)
Pharmacy requesting refill of topamax, patient not due for appointment until October.

## 2012-07-18 ENCOUNTER — Encounter: Payer: PRIVATE HEALTH INSURANCE | Admitting: Nurse Practitioner

## 2012-08-01 ENCOUNTER — Ambulatory Visit (INDEPENDENT_AMBULATORY_CARE_PROVIDER_SITE_OTHER): Payer: PRIVATE HEALTH INSURANCE | Admitting: Nurse Practitioner

## 2012-08-01 ENCOUNTER — Encounter: Payer: Self-pay | Admitting: Nurse Practitioner

## 2012-08-01 VITALS — BP 136/80 | HR 69 | Ht 69.0 in | Wt 175.0 lb

## 2012-08-01 DIAGNOSIS — G43909 Migraine, unspecified, not intractable, without status migrainosus: Secondary | ICD-10-CM

## 2012-08-01 MED ORDER — PROMETHAZINE HCL 25 MG RE SUPP: 25.0000 mg | Freq: Four times a day (QID) | RECTAL | Status: DC | PRN

## 2012-08-01 MED ORDER — ATENOLOL 50 MG PO TABS: 50.0000 mg | ORAL_TABLET | Freq: Every day | ORAL | Status: DC

## 2012-08-01 MED ORDER — SUMATRIPTAN SUCCINATE 100 MG PO TABS: 100.0000 mg | ORAL_TABLET | ORAL | Status: DC | PRN

## 2012-08-01 NOTE — Patient Instructions (Signed)
Migraine Headache A migraine headache is an intense, throbbing pain on one or both sides of your head. A migraine can last for 30 minutes to several hours. CAUSES  The exact cause of a migraine headache is not always known. However, a migraine may be caused when nerves in the brain become irritated and release chemicals that cause inflammation. This causes pain. SYMPTOMS  Pain on one or both sides of your head.  Pulsating or throbbing pain.  Severe pain that prevents daily activities.  Pain that is aggravated by any physical activity.  Nausea, vomiting, or both.  Dizziness.  Pain with exposure to bright lights, loud noises, or activity.  General sensitivity to bright lights, loud noises, or smells. Before you get a migraine, you may get warning signs that a migraine is coming (aura). An aura may include:  Seeing flashing lights.  Seeing bright spots, halos, or zig-zag lines.  Having tunnel vision or blurred vision.  Having feelings of numbness or tingling.  Having trouble talking.  Having muscle weakness. MIGRAINE TRIGGERS  Alcohol.  Smoking.  Stress.  Menstruation.  Aged cheeses.  Foods or drinks that contain nitrates, glutamate, aspartame, or tyramine.  Lack of sleep.  Chocolate.  Caffeine.  Hunger.  Physical exertion.  Fatigue.  Medicines used to treat chest pain (nitroglycerine), birth control pills, estrogen, and some blood pressure medicines. DIAGNOSIS  A migraine headache is often diagnosed based on:  Symptoms.  Physical examination.  A CT scan or MRI of your head. TREATMENT Medicines may be given for pain and nausea. Medicines can also be given to help prevent recurrent migraines.  HOME CARE INSTRUCTIONS  Only take over-the-counter or prescription medicines for pain or discomfort as directed by your caregiver. The use of long-term narcotics is not recommended.  Lie down in a dark, quiet room when you have a migraine.  Keep a journal  to find out what may trigger your migraine headaches. For example, write down:  What you eat and drink.  How much sleep you get.  Any change to your diet or medicines.  Limit alcohol consumption.  Quit smoking if you smoke.  Get 7 to 9 hours of sleep, or as recommended by your caregiver.  Limit stress.  Keep lights dim if bright lights bother you and make your migraines worse. SEEK IMMEDIATE MEDICAL CARE IF:   Your migraine becomes severe.  You have a fever.  You have a stiff neck.  You have vision loss.  You have muscular weakness or loss of muscle control.  You start losing your balance or have trouble walking.  You feel faint or pass out.  You have severe symptoms that are different from your first symptoms. MAKE SURE YOU:   Understand these instructions.  Will watch your condition.  Will get help right away if you are not doing well or get worse. Document Released: 07/05/2005 Document Revised: 09/27/2011 Document Reviewed: 06/25/2011 [employer] Patient Information 2013 [employer].  

## 2012-08-01 NOTE — Progress Notes (Signed)
S: Pt returns today for yearly office visit. She had 2 kidney stones in September. She had been off the Topamax since June because she did not feel it was helping. During that time her BP was elevated. Today her BP was 164/94 repeat with manual cuff was 136/80. Her headaches are severe 9 moderate 9 milds 0. She has been on Atenolol, Effexor and Topamax in the past.  O: Alert, oriented, NAD  A: Migraine ? Elevated BP  P: Start Atenolol 50mg  1/2 to 1 tab q day  Purchase BP cuff keep daily BP  And migraine log If BP elevated over 170/96 hold Imitex Refill Phenergan If BP remains elevated will need to see Dr Nolen Mu at [facility] who is her PCP

## 2012-09-12 ENCOUNTER — Ambulatory Visit (INDEPENDENT_AMBULATORY_CARE_PROVIDER_SITE_OTHER): Payer: PRIVATE HEALTH INSURANCE | Admitting: Nurse Practitioner

## 2012-09-12 ENCOUNTER — Encounter: Payer: Self-pay | Admitting: Nurse Practitioner

## 2012-09-12 ENCOUNTER — Ambulatory Visit: Payer: PRIVATE HEALTH INSURANCE | Admitting: Family Medicine

## 2012-09-12 VITALS — BP 154/90 | HR 65 | Ht 69.0 in | Wt 180.0 lb

## 2012-09-12 DIAGNOSIS — G43909 Migraine, unspecified, not intractable, without status migrainosus: Secondary | ICD-10-CM

## 2012-09-12 DIAGNOSIS — I10 Essential (primary) hypertension: Secondary | ICD-10-CM

## 2012-09-12 MED ORDER — TRAMADOL HCL 50 MG PO TABS: 50.0000 mg | ORAL_TABLET | Freq: Four times a day (QID) | ORAL | Status: DC | PRN

## 2012-09-12 NOTE — Progress Notes (Signed)
S: Pt returns today to follow up on migraine and HTN. She took the Atenolol at 50 mg and did not find it was helpful for her migraines or her BP. There were days her BP was as high as 200/100. She has a strong family history including a twin sister. She was taking BP at home and at work. She went to see Dr Ferd Hibbs and first he put her on HCTZ which caused her to have diarrhea and then Lisinopril which caused her to have headache, 5 lb weight gain and cough. She stopped that as well and today does not have headache and BP is 154/90.  O: Alert, oriented, NAD Cardiac: RRR Lungs : clear Extremities: + 2 edema  A: Migraine HTN Medication RXN  P: Pt advised to remain off Lisinopril Until she sees Dr Ferd Hibbs she may increase dose of Atenolol to 100 mg She will hold Imitrex if BP greater than 170/ 90 In event BP is elevated and Imitrex is held she may take Ultram and Phenergan RTC PRN/ keep yearly visit

## 2012-09-12 NOTE — Patient Instructions (Addendum)
Migraine Headache A migraine headache is an intense, throbbing pain on one or both sides of your head. A migraine can last for 30 minutes to several hours. CAUSES  The exact cause of a migraine headache is not always known. However, a migraine may be caused when nerves in the brain become irritated and release chemicals that cause inflammation. This causes pain. SYMPTOMS  Pain on one or both sides of your head.  Pulsating or throbbing pain.  Severe pain that prevents daily activities.  Pain that is aggravated by any physical activity.  Nausea, vomiting, or both.  Dizziness.  Pain with exposure to bright lights, loud noises, or activity.  General sensitivity to bright lights, loud noises, or smells. Before you get a migraine, you may get warning signs that a migraine is coming (aura). An aura may include:  Seeing flashing lights.  Seeing bright spots, halos, or zig-zag lines.  Having tunnel vision or blurred vision.  Having feelings of numbness or tingling.  Having trouble talking.  Having muscle weakness. MIGRAINE TRIGGERS  Alcohol.  Smoking.  Stress.  Menstruation.  Aged cheeses.  Foods or drinks that contain nitrates, glutamate, aspartame, or tyramine.  Lack of sleep.  Chocolate.  Caffeine.  Hunger.  Physical exertion.  Fatigue.  Medicines used to treat chest pain (nitroglycerine), birth control pills, estrogen, and some blood pressure medicines. DIAGNOSIS  A migraine headache is often diagnosed based on:  Symptoms.  Physical examination.  A CT scan or MRI of your head. TREATMENT Medicines may be given for pain and nausea. Medicines can also be given to help prevent recurrent migraines.  HOME CARE INSTRUCTIONS  Only take over-the-counter or prescription medicines for pain or discomfort as directed by your caregiver. The use of long-term narcotics is not recommended.  Lie down in a dark, quiet room when you have a migraine.  Keep a journal  to find out what may trigger your migraine headaches. For example, write down:  What you eat and drink.  How much sleep you get.  Any change to your diet or medicines.  Limit alcohol consumption.  Quit smoking if you smoke.  Get 7 to 9 hours of sleep, or as recommended by your caregiver.  Limit stress.  Keep lights dim if bright lights bother you and make your migraines worse. SEEK IMMEDIATE MEDICAL CARE IF:   Your migraine becomes severe.  You have a fever.  You have a stiff neck.  You have vision loss.  You have muscular weakness or loss of muscle control.  You start losing your balance or have trouble walking.  You feel faint or pass out.  You have severe symptoms that are different from your first symptoms. MAKE SURE YOU:   Understand these instructions.  Will watch your condition.  Will get help right away if you are not doing well or get worse. Document Released: 07/05/2005 Document Revised: 09/27/2011 Document Reviewed: 06/25/2011 [employer] Patient Information 2013 [employer].  

## 2013-08-21 ENCOUNTER — Other Ambulatory Visit: Payer: Self-pay

## 2013-08-21 DIAGNOSIS — Z1231 Encounter for screening mammogram for malignant neoplasm of breast: Secondary | ICD-10-CM

## 2013-08-30 ENCOUNTER — Ambulatory Visit
Admission: RE | Admit: 2013-08-30 | Discharge: 2013-08-30 | Disposition: A | Discharge status: Home / Self Care | Payer: PRIVATE HEALTH INSURANCE | Source: Ambulatory Visit

## 2013-08-30 DIAGNOSIS — Z1231 Encounter for screening mammogram for malignant neoplasm of breast: Secondary | ICD-10-CM

## 2013-09-11 ENCOUNTER — Encounter: Payer: PRIVATE HEALTH INSURANCE | Admitting: Nurse Practitioner

## 2013-10-01 ENCOUNTER — Other Ambulatory Visit: Payer: Self-pay

## 2013-10-02 ENCOUNTER — Encounter: Payer: Self-pay | Admitting: Nurse Practitioner

## 2013-10-02 ENCOUNTER — Ambulatory Visit (INDEPENDENT_AMBULATORY_CARE_PROVIDER_SITE_OTHER): Payer: PRIVATE HEALTH INSURANCE | Admitting: Nurse Practitioner

## 2013-10-02 VITALS — BP 149/105 | HR 72 | Ht 69.0 in | Wt 172.0 lb

## 2013-10-02 DIAGNOSIS — I10 Essential (primary) hypertension: Secondary | ICD-10-CM

## 2013-10-02 DIAGNOSIS — G43909 Migraine, unspecified, not intractable, without status migrainosus: Secondary | ICD-10-CM

## 2013-10-02 DIAGNOSIS — C50919 Malignant neoplasm of unspecified site of unspecified female breast: Secondary | ICD-10-CM

## 2013-10-02 MED ORDER — ATENOLOL 100 MG PO TABS: 100.0000 mg | ORAL_TABLET | Freq: Every day | ORAL | Status: DC

## 2013-10-02 MED ORDER — SUMATRIPTAN SUCCINATE 100 MG PO TABS: 100.0000 mg | ORAL_TABLET | ORAL | Status: DC | PRN

## 2013-10-02 MED ORDER — HYDROCHLOROTHIAZIDE 25 MG PO TABS: 25.0000 mg | ORAL_TABLET | Freq: Every day | ORAL | Status: DC

## 2013-10-02 MED ORDER — PROMETHAZINE HCL 25 MG RE SUPP: 25.0000 mg | Freq: Four times a day (QID) | RECTAL | Status: DC | PRN

## 2013-10-02 MED ORDER — ALPRAZOLAM 0.5 MG PO TABS
0.5000 mg | ORAL_TABLET | Freq: Every evening | ORAL | Status: DC | PRN
Start: 2013-10-02 — End: 2015-08-22

## 2013-10-02 NOTE — Patient Instructions (Signed)
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
## 2013-11-13 ENCOUNTER — Encounter: Payer: Self-pay | Admitting: *Deleted

## 2013-11-20 ENCOUNTER — Ambulatory Visit (INDEPENDENT_AMBULATORY_CARE_PROVIDER_SITE_OTHER): Payer: PRIVATE HEALTH INSURANCE | Admitting: Cardiovascular Disease

## 2013-11-20 ENCOUNTER — Encounter: Payer: Self-pay | Admitting: Cardiovascular Disease

## 2013-11-20 VITALS — BP 134/80 | HR 53 | Resp 16 | Ht 69.5 in | Wt 172.2 lb

## 2013-11-20 DIAGNOSIS — N200 Calculus of kidney: Secondary | ICD-10-CM

## 2013-11-20 DIAGNOSIS — I10 Essential (primary) hypertension: Secondary | ICD-10-CM

## 2013-11-20 DIAGNOSIS — R0789 Other chest pain: Secondary | ICD-10-CM

## 2013-11-20 MED ORDER — NEBIVOLOL HCL 10 MG PO TABS: 10.0000 mg | ORAL_TABLET | Freq: Every day | ORAL | Status: DC

## 2013-11-20 NOTE — Patient Instructions (Signed)
STOP Atenolol.  Start Bystolic 10mg  daily.  Samples given x one month.  Call before you run out.  We will give you samples if available, if not we will need to send a prescription to your pharmacy.  Dr. Sallyanne Kuster recommends that you schedule a follow-up appointment in: ONE YEAR.

## 2013-11-21 ENCOUNTER — Encounter: Payer: Self-pay | Admitting: Cardiovascular Disease

## 2013-11-21 DIAGNOSIS — I10 Essential (primary) hypertension: Secondary | ICD-10-CM | POA: Insufficient documentation

## 2013-11-21 DIAGNOSIS — N200 Calculus of kidney: Secondary | ICD-10-CM | POA: Insufficient documentation

## 2013-11-21 NOTE — Assessment & Plan Note (Signed)
Although I am not sure what type of kidney stones she has, it is likely that the thiazide diuretic may help reduce kidney stone recurrence and she should continue.

## 2013-11-21 NOTE — Assessment & Plan Note (Signed)
Is likely she has essential hypertension, although there are a few features to suggest she could possibly have renal artery stenosis. Since she has normal renal function and her blood pressure control is not that bad, it is not imperative that we perform an evaluation for renal artery stenosis at this time. She would like to put that off if possible. We'll try to switch her to an agent that'll cause less bradycardia for the same amount of blood pressure reduction. I gave her samples of bystolic 10 mg daily. Keep a record of her blood pressure recordings at home and send [place] a copy to

## 2013-11-21 NOTE — Progress Notes (Signed)
Patient ID: GISELLA ALWINE, female   DOB: November 10, 1955, 58 y.o.   MRN: [id number]      Reason for office visit Hypertension  This is the first time I have met Mrs. Jeudy my patient. I have met her a couple of times when she accompanied her husband Awanda Mink to our office. She hasn't been seen in the cardiology office in almost 6 years.  She is here to evaluate fluctuating BP's. Blood pressure has been as high as 200/100 mm Hg and she feels very poorly with headaches and dizziness. Blood pressure control has not improved, but her systolic blood pressure remains frequently elevated. She complains of fatigue on the current dose of atenolol . Occasional chest discomfort she thinks is looks related because she has it with lying down and after eating spicy foods. No complaints of dyspnea, edema, dizziness or claudication symptoms. NP who treats her migraines started her on Atenolol and increased HCTZ to 50m due to a HBP reading of @ 200/100 . Since then, her BP has been under good control. Had urticaria that comes on erratically over the last one or 2 weeks and lasts for 15 minutes at a time.  She has a long-standing history of migraines. She saw Dr. GEinar Gipears ago and was incidentally found to have a patent foramen ovale. Her echocardiogram was otherwise completely normal. Her nuclear stress test was normal in 2009.  Was recently diagnosed with nephrolithiasis. Check kidney ultrasound but not an evaluation of her renal arteries   Strong family history of premature CAD. Her mother had sudden cardiac death at age 5274and her father age 57 reportedly both from coronary disease. Her lipid profile is favorable.   Allergies  Allergen Reactions  . Codeine   . Morphine And Related   . Sulfa Antibiotics     Current Outpatient Prescriptions  Medication Sig Dispense Refill  . ALPRAZolam (XANAX) 0.5 MG tablet Take 1 tablet (0.5 mg total) by mouth at bedtime as needed for anxiety.  40 tablet  0  .  Calcium Carbonate-Vitamin D (CALCIUM + D PO) Take by mouth.        . diphenhydrAMINE (SOMINEX) 25 MG tablet Take 25 mg by mouth as needed for sleep.      .Marland Kitchenesomeprazole (NEXIUM) 40 MG packet Take 40 mg by mouth daily before breakfast.        . estrogens, conjugated, (PREMARIN) 0.625 MG tablet Take 1.25 mg by mouth daily. Take daily for 21 days then do not take for 7 days.      . hydrochlorothiazide (HYDRODIURIL) 25 MG tablet Take 1 tablet (25 mg total) by mouth daily.  30 tablet  11  . Multiple Vitamin (MULTIVITAMIN PO) Take by mouth.        . promethazine (PHENERGAN) 25 MG suppository Place 1 suppository (25 mg total) rectally every 6 (six) hours as needed for nausea.  12 each  3  . SUMAtriptan (IMITREX) 100 MG tablet Take 1 tablet (100 mg total) by mouth as needed.  18 tablet  11  . nebivolol (BYSTOLIC) 10 MG tablet Take 1 tablet (10 mg total) by mouth daily.  30 tablet  11   No current facility-administered medications for this visit.    Past Medical History  Diagnosis Date  . Migraine   . History of migraines   . Menopause   . Seasonal allergies   . Cancer     cervical  . Abnormal Pap smear 08/2005  . Diverticulosis   .  Kidney stones 2012  . PFO (patent foramen ovale)     Past Surgical History  Procedure Laterality Date  . Total abdominal hysterectomy      cervical cancer  . Partial mastectomy  2008  . Tubal ligation  1988  . Bunionectomy  1987  . US echocardiography  03/21/2008    Trace MR,TR & PR, positive saline contrast bubble study  . Nm myocar perf wall motion  02/28/2008    normal    Family History  Problem Relation Age of Onset  . Heart disease Father     heart attack  . Hypertension Father   . Heart disease Mother     heart attack    History   Social History  . Marital Status: Married    Spouse Name: N/A    Number of Children: N/A  . Years of Education: N/A   Occupational History  . Not on file.   Social History Main Topics  . Smoking status:  Never Smoker   . Smokeless tobacco: Never Used  . Alcohol Use: Yes     Comment: rare / wine  . Drug Use: No  . Sexual Activity: Not on file   Other Topics Concern  . Not on file   Social History Narrative  . No narrative on file    Review of systems: The patient specifically denies any chest pain at rest or with exertion, dyspnea at rest or with exertion, orthopnea, paroxysmal nocturnal dyspnea, syncope, palpitations, focal neurological deficits, intermittent claudication, lower extremity edema, unexplained weight gain, cough, hemoptysis or wheezing.  The patient also denies abdominal pain, nausea, vomiting, dysphagia, diarrhea, constipation, polyuria, polydipsia, dysuria, hematuria, frequency, urgency, abnormal bleeding or bruising, fever, chills, unexpected weight changes, mood swings, change in skin or hair texture, change in voice quality, auditory or visual problems, allergic reactions or rashes, new musculoskeletal complaints other than usual "aches and pains".   PHYSICAL EXAM BP 134/80  Pulse 53  Resp 16  Ht 5' 9.5" (1.765 m)  Wt 172 lb 3.2 oz (78.109 kg)  BMI 25.07 kg/m2  General: Alert, oriented x3, no distress Head: no evidence of trauma, PERRL, EOMI, no exophtalmos or lid lag, no myxedema, no xanthelasma; normal ears, nose and oropharynx Neck: normal jugular venous pulsations and no hepatojugular reflux; brisk carotid pulses without delay and no carotid bruits Chest: clear to auscultation, no signs of consolidation by percussion or palpation, normal fremitus, symmetrical and full respiratory excursions Cardiovascular: normal position and quality of the apical impulse, regular rhythm, normal first and second heart sounds, no murmurs, rubs or gallops Abdomen: no tenderness or distention, no masses by palpation, no abnormal pulsatility or arterial bruits, normal bowel sounds, no hepatosplenomegaly Extremities: no clubbing, cyanosis or edema; 2+ radial, ulnar and brachial  pulses bilaterally; 2+ right femoral, posterior tibial and dorsalis pedis pulses; 2+ left femoral, posterior tibial and dorsalis pedis pulses; no subclavian or femoral bruits Neurological: grossly nonfocal   EKG:  sinus bradycardia otherwise normal  Lipid Panel  No results found for this basename: chol, trig, hdl, cholhdl, vldl, ldlcalc    BMET    Component Value Date/Time   NA 139 03/21/2007 1600   K 3.3* 03/21/2007 1600   CL 111 03/21/2007 1600   CO2 22 03/21/2007 1600   GLUCOSE 105* 03/21/2007 1600   BUN 10 03/21/2007 1600   CREATININE 1.10 03/21/2007 1600   CALCIUM 8.8 03/21/2007 1600   GFRNONAA 53* 03/21/2007 1600   GFRAA  Value: >60  The eGFR has been calculated using the MDRD equation. This calculation has not been validated in all clinical 03/21/2007 1600     ASSESSMENT AND PLAN HTN (hypertension) Is likely she has essential hypertension, although there are a few features to suggest she could possibly have renal artery stenosis. Since she has normal renal function and her blood pressure control is not that bad, it is not imperative that we perform an evaluation for renal artery stenosis at this time. She would like to put that off if possible. We'll try to switch her to an agent that'll cause less bradycardia for the same amount of blood pressure reduction. I gave her samples of bystolic 10 mg daily. Keep a record of her blood pressure recordings at home and send [place] a copy to   Patient Instructions  STOP Atenolol.  Start Bystolic 91PH daily.  Samples given x one month.  Call before you run out.  We will give you samples if available, if not we will need to send a prescription to your pharmacy.  Dr. Sallyanne Kuster recommends that you schedule a follow-up appointment in: ONE YEAR.      Orders Placed This Encounter  Procedures  . EKG 12-Lead   Meds ordered this encounter  Medications  . diphenhydrAMINE (SOMINEX) 25 MG tablet    Sig: Take 25 mg by mouth as needed for sleep.  .  nebivolol (BYSTOLIC) 10 MG tablet    Sig: Take 1 tablet (10 mg total) by mouth daily.    Dispense:  30 tablet    Refill:  [address] [place]  Sanda Klein, MD, [facility] [phone] office [phone] pager

## 2013-11-28 ENCOUNTER — Telehealth: Payer: Self-pay | Admitting: Cardiovascular Disease

## 2013-11-28 NOTE — Telephone Encounter (Signed)
Wants to discuss new med Bystolic  Making her extremely tired.  Has some questions .  Please call

## 2013-11-28 NOTE — Telephone Encounter (Signed)
Agree 

## 2013-11-28 NOTE — Telephone Encounter (Signed)
RN spoke to patient. Raven [name] states she was started on Bystolic last week .she states every thing doing great except she is tired and drowsy, Patient states she is taking at bedtime. RN advised patient to stick with it for another few weeks if no changes call office back. Will inform Dr Sallyanne Kuster   PATIENT VERBALIZED UNDERSTANDING.

## 2013-12-20 ENCOUNTER — Telehealth: Payer: Self-pay | Admitting: Cardiovascular Disease

## 2013-12-20 NOTE — Telephone Encounter (Signed)
Pt faxed in her blood pressure reading on June 1st. Dr C was suppose to review them and decide what to do about her medicine and than call it in. Pt have not heard anything and no medicine have been called in. He had given her some samples of Bystolic 10 mg.

## 2013-12-20 NOTE — Telephone Encounter (Signed)
Dr. Sallyanne Kuster reviewed the BP's and decreased the bystolic to 5 mg , pt. called and informed

## 2013-12-25 ENCOUNTER — Telehealth: Payer: Self-pay | Admitting: Cardiovascular Disease

## 2013-12-25 MED ORDER — NEBIVOLOL HCL 10 MG PO TABS: 10.0000 mg | ORAL_TABLET | Freq: Every day | ORAL | Status: DC

## 2013-12-25 NOTE — Telephone Encounter (Signed)
Dr C was suppose to call in a prescription for Raven [name] is still not at the pharmacy. Would you please call it in today to [phone].

## 2013-12-25 NOTE — Telephone Encounter (Signed)
Rx was sent to pharmacy electronically. 

## 2014-05-20 ENCOUNTER — Encounter: Payer: Self-pay | Admitting: Cardiovascular Disease

## 2014-10-28 ENCOUNTER — Other Ambulatory Visit: Payer: Self-pay | Admitting: *Deleted

## 2014-10-28 DIAGNOSIS — G43911 Migraine, unspecified, intractable, with status migrainosus: Secondary | ICD-10-CM

## 2014-10-28 MED ORDER — SUMATRIPTAN SUCCINATE 100 MG PO TABS: 100.0000 mg | ORAL_TABLET | ORAL | Status: DC | PRN

## 2014-10-28 NOTE — Telephone Encounter (Signed)
Follow up appointment is made and patient is authorized to have refills of her Imitrex.

## 2014-11-05 ENCOUNTER — Ambulatory Visit (INDEPENDENT_AMBULATORY_CARE_PROVIDER_SITE_OTHER): Payer: PRIVATE HEALTH INSURANCE | Admitting: Physician Assistant

## 2014-11-05 ENCOUNTER — Encounter: Payer: Self-pay | Admitting: Physician Assistant

## 2014-11-05 VITALS — BP 126/81 | HR 65 | Ht 70.0 in | Wt 180.0 lb

## 2014-11-05 DIAGNOSIS — G43009 Migraine without aura, not intractable, without status migrainosus: Secondary | ICD-10-CM

## 2014-11-05 MED ORDER — PROMETHAZINE HCL 25 MG RE SUPP: 25.0000 mg | Freq: Four times a day (QID) | RECTAL | Status: DC | PRN

## 2014-11-05 NOTE — Patient Instructions (Signed)

## 2014-11-05 NOTE — Progress Notes (Signed)
Patient ID: Raven [name], female   DOB: 1956/05/09, 59 y.o.   MRN: [id number] History:  Raven [name] is a 59 y.o. [phone] who presents to clinic today for follow up of headaches   Left side behind eye/temple.  Tooth pain is associated.  Severe, worse with movement, noises, light.  Assocaited with nausea and vomiting.    Imitrex works within 30 minutes mostly.  She sometimes can just use 1/2 with good efficacy.   Reports she has trialed many preventive medications including Topamax, several anti-depressants and atenolol.  She is not interested in Botox at this time.  She is interested in being a part of research studies for HA prevention.   She is presently on bystolic for HTN at 5mg  daily.    HIT6:73 Number of days in the last 4 weeks with:  Severe headache: 12 Moderate headache: 2 Mild headache: 2  No headache: 12   Past Medical History  Diagnosis Date  . Migraine   . History of migraines   . Menopause   . Seasonal allergies   . Cancer     cervical  . Abnormal Pap smear 08/2005  . Diverticulosis   . Kidney stones 2012  . PFO (patent foramen ovale)     History   Social History  . Marital Status: Married    Spouse Name: N/A  . Number of Children: N/A  . Years of Education: N/A   Occupational History  . Not on file.   Social History Main Topics  . Smoking status: Never Smoker   . Smokeless tobacco: Never Used  . Alcohol Use: Yes     Comment: rare / wine  . Drug Use: No  . Sexual Activity: Not on file   Other Topics Concern  . Not on file   Social History Narrative    Family History  Problem Relation Age of Onset  . Heart disease Father     heart attack  . Hypertension Father   . Heart disease Mother     heart attack    Allergies  Allergen Reactions  . Codeine   . Morphine And Related   . Sulfa Antibiotics     Current Outpatient Prescriptions on File Prior to Visit  Medication Sig Dispense Refill  . ALPRAZolam (XANAX) 0.5 MG tablet Take 1  tablet (0.5 mg total) by mouth at bedtime as needed for anxiety. 40 tablet 0  . Calcium Carbonate-Vitamin D (CALCIUM + D PO) Take by mouth.      . diphenhydrAMINE (SOMINEX) 25 MG tablet Take 25 mg by mouth as needed for sleep.    Marland Kitchen esomeprazole (NEXIUM) 40 MG packet Take 40 mg by mouth daily before breakfast.      . estrogens, conjugated, (PREMARIN) 0.625 MG tablet Take 1.25 mg by mouth daily. Take daily for 21 days then do not take for 7 days.    . hydrochlorothiazide (HYDRODIURIL) 25 MG tablet Take 1 tablet (25 mg total) by mouth daily. 30 tablet 11  . Multiple Vitamin (MULTIVITAMIN PO) Take by mouth.      . nebivolol (BYSTOLIC) 10 MG tablet Take 1 tablet (10 mg total) by mouth daily. 30 tablet 10  . promethazine (PHENERGAN) 25 MG suppository Place 1 suppository (25 mg total) rectally every 6 (six) hours as needed for nausea. 12 each 3  . SUMAtriptan (IMITREX) 100 MG tablet Take 1 tablet (100 mg total) by mouth as needed. 18 tablet 11   No current facility-administered medications on file prior  to visit.     Review of Systems:  All pertinent positive/negative included in HPI, all other review of systems are negative  Objective:  Physical Exam BP 126/81 mmHg  Pulse 65  Ht 5\' 10"  (1.778 m)  Wt 180 lb (81.647 kg)  BMI 25.83 kg/m2 CONSTITUTIONAL: Well-developed, well-nourished female in no acute distress.  EYES: EOM intact ENT: Normocephalic CARDIOVASCULAR: Regular rate and rhythm with no adventitious sounds.  No edema, 2+ distal pulses. RESPIRATORY: Normal rate. Clear to auscultation bilaterally.  ENDOCRINE: Normal thyroid.  MUSCULOSKELETAL: Normal ROM, normal strength and sensation equal bilaterally SKIN: Warm, dry without erythema  NEUROLOGICAL: Alert, oriented, CN II-XII grossly intact, Strength and sensation intact and equal bilaterally.   PSYCH: Normal behavior, mood   Assessment & Plan:  Assessment: Migraine Headache without aura - stable HTN -  improved  Plans: Continue Imitrex for acute HA treatment.  Do not use if BP is up/uncontrolled.  Rx was called in last week.  Continue HTN management with PCP Continue Phenergan for nausea/vomiting with HA.  Rx provided today. May look into HA prevention study Follow-up in 1 year or sooner PRN  Paticia Stack, PA-C 11/05/2014 11:14 AM

## 2015-01-17 ENCOUNTER — Other Ambulatory Visit: Payer: Self-pay | Admitting: Cardiovascular Disease

## 2015-01-17 NOTE — Telephone Encounter (Signed)
Rx(s) sent to pharmacy electronically. OV 02/24/15

## 2015-02-24 ENCOUNTER — Ambulatory Visit (INDEPENDENT_AMBULATORY_CARE_PROVIDER_SITE_OTHER): Payer: PRIVATE HEALTH INSURANCE | Admitting: Cardiovascular Disease

## 2015-02-24 ENCOUNTER — Encounter: Payer: Self-pay | Admitting: Cardiovascular Disease

## 2015-02-24 VITALS — BP 138/77 | HR 58 | Resp 16 | Ht 69.5 in | Wt 189.6 lb

## 2015-02-24 DIAGNOSIS — I10 Essential (primary) hypertension: Secondary | ICD-10-CM

## 2015-02-24 MED ORDER — NEBIVOLOL HCL 10 MG PO TABS: ORAL_TABLET | ORAL | Status: DC

## 2015-02-24 NOTE — Progress Notes (Signed)
Patient ID: Raven [name], female   DOB: Sep 24, 1955, 59 y.o.   MRN: [id number]     Cardiology Office Note   Date:  02/24/2015   ID:  Raven [name], DOB Mar 21, 1956, MRN [id number]  PCP:  Thressa Sheller, MD  Cardiologist:   Sanda Klein, MD   Chief Complaint  Patient presents with  . Annual Exam    patient reports cramps in ankles at night occurs once a week, otherwise, no cardiac complaints      History of Present Illness: Raven [name] is a 59 y.o. female who presents for  Follow-up of systemic hypertension and a history of migraine headaches. Since her last appointment her blood pressure has been consistently well controlled. She is enrolled in a migraine prevention pharmacological trial and has monthly visits where she has lab tests in vital signs checked. Her blood pressure is consistently normal.  She has occasional cramps in her calves in the evenings. She has not had new problems with kidney stones. She denies palpitations, dizziness, syncope, chest pain, dyspnea or lower extremity edema or any focal neurological deficits.    Past Medical History  Diagnosis Date  . Migraine   . History of migraines   . Menopause   . Seasonal allergies   . Cancer     cervical  . Abnormal Pap smear 08/2005  . Diverticulosis   . Kidney stones 2012  . PFO (patent foramen ovale)     Past Surgical History  Procedure Laterality Date  . Total abdominal hysterectomy      cervical cancer  . Partial mastectomy  2008  . Tubal ligation  1988  . Bunionectomy  1987  . US echocardiography  03/21/2008    Trace MR,TR & PR, positive saline contrast bubble study  . Nm myocar perf wall motion  02/28/2008    normal     Current Outpatient Prescriptions  Medication Sig Dispense Refill  . ALPRAZolam (XANAX) 0.5 MG tablet Take 1 tablet (0.5 mg total) by mouth at bedtime as needed for anxiety. 40 tablet 0  . Calcium Carbonate-Vitamin D (CALCIUM + D PO) Take by mouth.      .  diphenhydrAMINE (SOMINEX) 25 MG tablet Take 25 mg by mouth as needed for sleep.    Marland Kitchen esomeprazole (NEXIUM) 20 MG capsule Take 22.5 mg by mouth daily at 12 noon.    . Investigational - Study Medication Additional Study Details: [id number] drug trial for migraine headaches    . Multiple Vitamin (MULTIVITAMIN PO) Take by mouth.      . nebivolol (BYSTOLIC) 10 MG tablet TAKE 1 TABLET (10 MG TOTAL) BY MOUTH DAILY. 90 tablet 3  . PREMARIN 1.25 MG tablet Take 1.25 mg by mouth daily.  3  . promethazine (PHENERGAN) 25 MG suppository Place 1 suppository (25 mg total) rectally every 6 (six) hours as needed for nausea. 12 each 3  . SUMAtriptan (IMITREX) 100 MG tablet Take 1 tablet (100 mg total) by mouth as needed. 18 tablet 11   No current facility-administered medications for this visit.    Allergies:   Codeine; Morphine and related; and Sulfa antibiotics    Social History:  The patient  reports that she has never smoked. She has never used smokeless tobacco. She reports that she drinks alcohol. She reports that she does not use illicit drugs.   Family History:  The patient's family history includes Heart disease in her father and mother; Hypertension in her father.    ROS:  Please see the  history of present illness.    Otherwise, review of systems positive for none.   All other systems are reviewed and negative.    PHYSICAL EXAM: VS:  BP 138/77 mmHg  Pulse 58  Resp 16  Ht 5' 9.5" (1.765 m)  Wt 189 lb 9.6 oz (86.002 kg)  BMI 27.61 kg/m2 , BMI Body mass index is 27.61 kg/(m^2).  General: Alert, oriented x3, no distress Head: no evidence of trauma, PERRL, EOMI, no exophtalmos or lid lag, no myxedema, no xanthelasma; normal ears, nose and oropharynx Neck: normal jugular venous pulsations and no hepatojugular reflux; brisk carotid pulses without delay and no carotid bruits Chest: clear to auscultation, no signs of consolidation by percussion or palpation, normal fremitus, symmetrical and full  respiratory excursions Cardiovascular: normal position and quality of the apical impulse, regular rhythm, normal first and second heart sounds, no  murmurs, rubs or gallops Abdomen: no tenderness or distention, no masses by palpation, no abnormal pulsatility or arterial bruits, normal bowel sounds, no hepatosplenomegaly Extremities: no clubbing, cyanosis or edema; 2+ radial, ulnar and brachial pulses bilaterally; 2+ right femoral, posterior tibial and dorsalis pedis pulses; 2+ left femoral, posterior tibial and dorsalis pedis pulses; no subclavian or femoral bruits Neurological: grossly nonfocal Psych: euthymic mood, full affect   EKG:  EKG is ordered today. The ekg ordered today demonstrates  Sinus bradycardia otherwise normal , QTC 418 ms   Recent Labs: No results found for requested labs within last 365 days.    Lipid Panel No results found for: CHOL, TRIG, HDL, CHOLHDL, VLDL, LDLCALC, LDLDIRECT    Wt Readings from Last 3 Encounters:  02/24/15 189 lb 9.6 oz (86.002 kg)  11/05/14 180 lb (81.647 kg)  11/20/13 172 lb 3.2 oz (78.109 kg)      ASSESSMENT AND PLAN:   well-controlled systemic hypertension with well-tolerated medication regimen. No changes made to her medication. Suggested drinking more water to avoid leg cramps. Will follow-up on a yearly basis.    Current medicines are reviewed at length with the patient today.  The patient does not have concerns regarding medicines.  The following changes have been made:  no change  Labs/ tests ordered today include:   Orders Placed This Encounter  Procedures  . EKG 12-Lead     Patient Instructions  Your physician wants you to follow-up in: [place] will receive a reminder letter in the mail two months in advance. If you don't receive a letter, please call our office to schedule the follow-up appointment.       Mikael Spray, MD  02/24/2015 8:21 AM    Sanda Klein, MD, [facility]  [facility] [phone] office [phone] pager

## 2015-02-24 NOTE — Patient Instructions (Signed)
Your physician wants you to follow-up in: [place] will receive a reminder letter in the mail two months in advance. If you don't receive a letter, please call our office to schedule the follow-up appointment.

## 2015-04-17 ENCOUNTER — Encounter: Payer: Self-pay | Admitting: Cardiovascular Disease

## 2015-08-22 ENCOUNTER — Ambulatory Visit (INDEPENDENT_AMBULATORY_CARE_PROVIDER_SITE_OTHER): Payer: PRIVATE HEALTH INSURANCE | Admitting: Cardiovascular Disease

## 2015-08-22 ENCOUNTER — Encounter: Payer: Self-pay | Admitting: Cardiovascular Disease

## 2015-08-22 VITALS — BP 124/88 | HR 55 | Ht 69.0 in | Wt 183.2 lb

## 2015-08-22 DIAGNOSIS — G43009 Migraine without aura, not intractable, without status migrainosus: Secondary | ICD-10-CM | POA: Diagnosis not present

## 2015-08-22 DIAGNOSIS — I10 Essential (primary) hypertension: Secondary | ICD-10-CM | POA: Diagnosis not present

## 2015-08-22 MED ORDER — AMLODIPINE BESYLATE 2.5 MG PO TABS: 2.5000 mg | ORAL_TABLET | Freq: Every day | ORAL | Status: DC

## 2015-08-22 NOTE — Progress Notes (Signed)
Patient ID: Raven [name], female   DOB: 1956/05/29, 60 y.o.   MRN: [id number]     Cardiology Office Note    Date:  08/22/2015   ID:  Raven [name], DOB September 06, 1955, MRN [id number]  PCP:  Thressa Sheller, MD  Cardiologist:   Sanda Klein, MD   Chief Complaint  Patient presents with  . Follow-up    has chest pain, no shortness of breath, no edema, has pain or cramping in legs, no lightheadedness or dizziness  . Hypertension    pt has been having some high bp reading she is concerned about, highest reading 200/105    History of Present Illness:  Raven [name] is a 60 y.o. female systemic hypertension and difficult to control migraines who presents for follow-up. She is enrolled in a clinical trial involving migraine treatment and has very frequent checks on her blood pressure. Her diastolic blood pressures consistently borderline elevated at 90-92 mmHg. At least on one occasion her blood pressure was significantly elevated at 200/105 mmHg. Her migraine headaches remain poorly controlled. She no longer complains of muscle cramps. She does not have exertional chest pain or dyspnea. She denies lower extremity edema. She denies fatigue on nebivolol, but her resting heart rate is in the mid 50s. Has not had syncope or palpitations.    Past Medical History  Diagnosis Date  . Migraine   . History of migraines   . Menopause   . Seasonal allergies   . Cancer (HCC)     cervical  . Abnormal Pap smear 08/2005  . Diverticulosis   . Kidney stones 2012  . PFO (patent foramen ovale)     Past Surgical History  Procedure Laterality Date  . Total abdominal hysterectomy      cervical cancer  . Partial mastectomy  2008  . Tubal ligation  1988  . Bunionectomy  1987  . US echocardiography  03/21/2008    Trace MR,TR & PR, positive saline contrast bubble study  . Nm myocar perf wall motion  02/28/2008    normal    Outpatient Prescriptions Prior to Visit  Medication Sig Dispense  Refill  . Calcium Carbonate-Vitamin D (CALCIUM + D PO) Take by mouth.      . Investigational - Study Medication Additional Study [id number] drug trial for migraine headaches    . Multiple Vitamin (MULTIVITAMIN PO) Take by mouth.      . nebivolol (BYSTOLIC) 10 MG tablet TAKE 1 TABLET (10 MG TOTAL) BY MOUTH DAILY. 90 tablet 3  . PREMARIN 1.25 MG tablet Take 1.25 mg by mouth daily.  3  . promethazine (PHENERGAN) 25 MG suppository Place 1 suppository (25 mg total) rectally every 6 (six) hours as needed for nausea. 12 each 3  . SUMAtriptan (IMITREX) 100 MG tablet Take 1 tablet (100 mg total) by mouth as needed. (Patient taking differently: Take 50 mg by mouth as needed. ) 18 tablet 11  . ALPRAZolam (XANAX) 0.5 MG tablet Take 1 tablet (0.5 mg total) by mouth at bedtime as needed for anxiety. (Patient not taking: Reported on 08/22/2015) 40 tablet 0  . diphenhydrAMINE (SOMINEX) 25 MG tablet Take 25 mg by mouth as needed for sleep. Reported on 08/22/2015    . esomeprazole (NEXIUM) 20 MG capsule Take 22.5 mg by mouth daily at 12 noon. Reported on 08/22/2015     No facility-administered medications prior to visit.     Allergies:   Codeine; Morphine and related; and Sulfa antibiotics   Social History  Social History  . Marital Status: Married    Spouse Name: N/A  . Number of Children: N/A  . Years of Education: N/A   Social History Main Topics  . Smoking status: Never Smoker   . Smokeless tobacco: Never Used  . Alcohol Use: Yes     Comment: rare / wine  . Drug Use: No  . Sexual Activity: Not Asked   Other Topics Concern  . None   Social History Narrative     Family History:  The patient's family history includes Heart disease in her father and mother; Hypertension in her father.   ROS:   Please see the history of present illness.    ROS All other systems reviewed and are negative.   PHYSICAL EXAM:   VS:  BP 124/88 mmHg  Pulse 55  Ht 5\' 9"  (1.753 m)  Wt 83.122 kg (183 lb 4 oz)   BMI 27.05 kg/m2   GEN: Well nourished, well developed, in no acute distress HEENT: normal Neck: no JVD, carotid bruits, or masses Cardiac: RRR; no murmurs, rubs, or gallops,no edema  Respiratory:  clear to auscultation bilaterally, normal work of breathing GI: soft, nontender, nondistended, + BS MS: no deformity or atrophy Skin: warm and dry, no rash Neuro:  Alert and Oriented x 3, Strength and sensation are intact Psych: euthymic mood, full affect  Wt Readings from Last 3 Encounters:  08/22/15 83.122 kg (183 lb 4 oz)  02/24/15 86.002 kg (189 lb 9.6 oz)  11/05/14 81.647 kg (180 lb)      Studies/Labs Reviewed:   EKG:  EKG is ordered today.  The ekg ordered today demonstrates mild sinus bradycardia, otherwise normal   ASSESSMENT:    1. Essential hypertension   2. Migraine without aura and without status migrainosus, not intractable      PLAN:  In order of problems listed above:  1. Blood pressure remains borderline high, especially elevated diastolic blood pressure. Would like to avoid diuretics due to history of frequent leg cramps in the past, which has improved with better hydration. Would not increase beta blocker dose due to relative bradycardia. Will add low-dose amlodipine 2.5 mg once daily and have asked her to send [place] blood pressure recordings after a couple weeks. 2. Hopefully amlodipine will have a positive rather than negative impact on migraine frequency    Medication Adjustments/Labs and Tests Ordered: Current medicines are reviewed at length with the patient today.  Concerns regarding medicines are outlined above.  Medication changes, Labs and Tests ordered today are listed in the Patient Instructions below. There are no Patient Instructions on file for this visit.     Mikael Spray, MD  08/22/2015 8:25 AM    [facility] [address], [place], [place]  [address] Phone: [phone]; Fax: [phone]

## 2015-08-22 NOTE — Patient Instructions (Signed)
Your physician has recommended you make the following change in your medication: START AMLODIPINE 2.5 MG DAILY  Your physician has requested that you regularly monitor and record your blood pressure readings at home.  PLEASE SEND YOUR READING TO THE OFFICE AFTER TWO (2) WEEKS. Please use the same machine at the same time of day to check your readings and record them to bring to your follow-up visit.  Dr. Sallyanne Kuster recommends that you schedule a follow-up appointment in: 6 MONTHS

## 2015-10-08 ENCOUNTER — Other Ambulatory Visit: Payer: Self-pay | Admitting: Obstetrics & Gynecology

## 2015-10-08 ENCOUNTER — Other Ambulatory Visit: Payer: Self-pay

## 2015-10-08 DIAGNOSIS — Z1231 Encounter for screening mammogram for malignant neoplasm of breast: Secondary | ICD-10-CM

## 2015-10-09 LAB — CYTOLOGY - PAP

## 2015-10-20 ENCOUNTER — Telehealth: Payer: Self-pay | Admitting: Cardiovascular Disease

## 2015-10-20 ENCOUNTER — Inpatient Hospital Stay (HOSPITAL_COMMUNITY)
Admission: EM | Admit: 2015-10-20 | Discharge: 2015-10-22 | DRG: 041 | Disposition: A | Discharge status: Home / Self Care | Payer: Commercial Managed Care - PPO | Attending: Internal Medicine | Admitting: Internal Medicine

## 2015-10-20 ENCOUNTER — Encounter (HOSPITAL_COMMUNITY): Payer: Self-pay | Admitting: *Deleted

## 2015-10-20 ENCOUNTER — Emergency Department (HOSPITAL_COMMUNITY): Payer: Commercial Managed Care - PPO

## 2015-10-20 DIAGNOSIS — R29700 NIHSS score 0: Secondary | ICD-10-CM | POA: Diagnosis present

## 2015-10-20 DIAGNOSIS — E785 Hyperlipidemia, unspecified: Secondary | ICD-10-CM | POA: Diagnosis present

## 2015-10-20 DIAGNOSIS — Z8541 Personal history of malignant neoplasm of cervix uteri: Secondary | ICD-10-CM

## 2015-10-20 DIAGNOSIS — R4701 Aphasia: Secondary | ICD-10-CM | POA: Diagnosis present

## 2015-10-20 DIAGNOSIS — I6789 Other cerebrovascular disease: Secondary | ICD-10-CM | POA: Diagnosis not present

## 2015-10-20 DIAGNOSIS — I82403 Acute embolism and thrombosis of unspecified deep veins of lower extremity, bilateral: Secondary | ICD-10-CM

## 2015-10-20 DIAGNOSIS — I10 Essential (primary) hypertension: Secondary | ICD-10-CM | POA: Diagnosis present

## 2015-10-20 DIAGNOSIS — I639 Cerebral infarction, unspecified: Secondary | ICD-10-CM | POA: Diagnosis present

## 2015-10-20 DIAGNOSIS — G43011 Migraine without aura, intractable, with status migrainosus: Secondary | ICD-10-CM | POA: Diagnosis present

## 2015-10-20 DIAGNOSIS — Q211 Atrial septal defect: Secondary | ICD-10-CM | POA: Diagnosis not present

## 2015-10-20 DIAGNOSIS — R2990 Unspecified symptoms and signs involving the nervous system: Secondary | ICD-10-CM

## 2015-10-20 DIAGNOSIS — I63412 Cerebral infarction due to embolism of left middle cerebral artery: Secondary | ICD-10-CM | POA: Diagnosis present

## 2015-10-20 DIAGNOSIS — R4781 Slurred speech: Secondary | ICD-10-CM

## 2015-10-20 DIAGNOSIS — G43909 Migraine, unspecified, not intractable, without status migrainosus: Secondary | ICD-10-CM | POA: Diagnosis present

## 2015-10-20 DIAGNOSIS — G43001 Migraine without aura, not intractable, with status migrainosus: Secondary | ICD-10-CM | POA: Diagnosis not present

## 2015-10-20 HISTORY — DX: Essential (primary) hypertension: I10

## 2015-10-20 LAB — COMPREHENSIVE METABOLIC PANEL
ALBUMIN: 3.7 g/dL (ref 3.5–5.0)
ALT: 17 U/L (ref 14–54)
AST: 26 U/L (ref 15–41)
Alkaline Phosphatase: 65 U/L (ref 38–126)
Anion gap: 10 (ref 5–15)
BILIRUBIN TOTAL: 0.5 mg/dL (ref 0.3–1.2)
BUN: 14 mg/dL (ref 6–20)
CALCIUM: 8.9 mg/dL (ref 8.9–10.3)
CHLORIDE: 104 mmol/L (ref 101–111)
CO2: 24 mmol/L (ref 22–32)
CREATININE: 0.99 mg/dL (ref 0.44–1.00)
GFR calc Af Amer: >60 mL/min (ref >60)
GLUCOSE: 120 mg/dL — AB (ref 65–99)
Potassium: 3.5 mmol/L (ref 3.5–5.1)
Sodium: 138 mmol/L (ref 135–145)
Total Protein: 6.3 g/dL — ABNORMAL LOW (ref 6.5–8.1)

## 2015-10-20 LAB — URINALYSIS, ROUTINE W REFLEX MICROSCOPIC
BILIRUBIN URINE: NEGATIVE
GLUCOSE, UA: NEGATIVE mg/dL
KETONES UR: NEGATIVE mg/dL
Leukocytes, UA: NEGATIVE
Nitrite: NEGATIVE
PH: 6.5 (ref 5.0–8.0)
Protein, ur: NEGATIVE mg/dL
Specific Gravity, Urine: 1.021 (ref 1.005–1.030)

## 2015-10-20 LAB — CBC
HCT: 41.6 % (ref 36.0–46.0)
Hemoglobin: 14 g/dL (ref 12.0–15.0)
MCH: 30.6 pg (ref 26.0–34.0)
MCHC: 33.7 g/dL (ref 30.0–36.0)
MCV: 90.8 fL (ref 78.0–100.0)
PLATELETS: 181 10*3/uL (ref 150–400)
RBC: 4.58 MIL/uL (ref 3.87–5.11)
RDW: 13 % (ref 11.5–15.5)
WBC: 7.2 10*3/uL (ref 4.0–10.5)

## 2015-10-20 LAB — URINE MICROSCOPIC-ADD ON

## 2015-10-20 MED ORDER — SENNOSIDES-DOCUSATE SODIUM 8.6-50 MG PO TABS: 1.0000 | ORAL_TABLET | Freq: Every evening | ORAL | Status: DC | PRN

## 2015-10-20 MED ORDER — ENOXAPARIN SODIUM 40 MG/0.4ML ~~LOC~~ SOLN
40.0000 mg | SUBCUTANEOUS | Status: DC
  Administered 2015-10-21: 40 mg via SUBCUTANEOUS
  Filled 2015-10-20: qty 0.4

## 2015-10-20 MED ORDER — SODIUM CHLORIDE 0.9% FLUSH
3.0000 mL | Freq: Two times a day (BID) | INTRAVENOUS | Status: DC
  Administered 2015-10-20 – 2015-10-21 (×3): 3 mL via INTRAVENOUS

## 2015-10-20 MED ORDER — STROKE: EARLY STAGES OF RECOVERY BOOK
Freq: Once | Status: AC
  Administered 2015-10-20
  Filled 2015-10-20: qty 1

## 2015-10-20 MED ORDER — ASPIRIN 81 MG PO CHEW
324.0000 mg | CHEWABLE_TABLET | Freq: Once | ORAL | Status: AC
  Administered 2015-10-20: 324 mg via ORAL
  Filled 2015-10-20: qty 4

## 2015-10-20 MED ORDER — LORAZEPAM 2 MG/ML IJ SOLN
1.0000 mg | Freq: Once | INTRAMUSCULAR | Status: AC
  Administered 2015-10-20: 1 mg via INTRAVENOUS
  Filled 2015-10-20: qty 1

## 2015-10-20 MED ORDER — GADOBENATE DIMEGLUMINE 529 MG/ML IV SOLN
17.0000 mL | Freq: Once | INTRAVENOUS | Status: AC | PRN
  Administered 2015-10-20: 17 mL via INTRAVENOUS

## 2015-10-20 MED ORDER — SODIUM CHLORIDE 0.9 % IV SOLN: INTRAVENOUS | Status: DC

## 2015-10-20 NOTE — Telephone Encounter (Signed)
Thanks. ED was appropriate recommendation.

## 2015-10-20 NOTE — ED Notes (Signed)
To ED via POV for eval of intermittent slurred speech since yesterday. Pt denies HA. States she does have a hx of migraines and is in a migraine med trial. States she is under stress at work and thought she was just stressed and having difficulty with words. No slurred speech noted at present. States last noted slurring was approx 30 min ago. No further neuro deficits noted.

## 2015-10-20 NOTE — Progress Notes (Signed)
Pt got admitted from ED with speech difficulty already resolved, pt alert and oriented, denies any pain, pt settled in bed with family and call light at bedside, will however continue to monitor, v/s stable. Obasog[place]-Asidi, Tommye Lehenbauer Efe

## 2015-10-20 NOTE — Telephone Encounter (Signed)
Returned call.  Pt reports slurring of speech which lasted for 4-5 mins at a time and happened several times today. Notes she had this happen starting at 9am and the symptoms were worse this morning, however still recurred this afternoon. Pt became concerned due to recurrence. Of note, she also c/o feeling "pins and needles"/partial numbness on left side of face which began this afternoon. Pt initially attributed to her medications and dismissed the pins and needles sensation as she'd had this in the past.  I gave recommendation to patient to go to ED for evaluation.  Recommendation for stroke r/o. Pt verbalized understanding and will have spouse drive her promptly.  ED Triage RN notified. Trish, Cardiology Inpatient Coordinator notified.

## 2015-10-20 NOTE — ED Provider Notes (Signed)
CSN: [id number]     Arrival date & time 10/20/15  1512 History   First MD Initiated Contact with Patient 10/20/15 1532     Chief Complaint  Patient presents with  . Aphasia     (Consider location/radiation/quality/duration/timing/severity/associated sxs/prior Treatment) The history is provided by the patient.  Patient c/o intermittent trouble getting words out onset 9 AM today.  Also described numbness around mouth and lower face earlier today.  Lasted a few minutes.  States was stressful day, but has never had similar trouble speaking with stress. No hx panic/anxiety attacks.  Hx migraines, but not headaches today, and no hx complicated migraines or any neuro symptoms w migranes. States could think of words she wanted to say, but they just wouldn't come out, or came out all jumbled up.  Has recurred a few times since, last time on way to hospital - spouse confirms. Speech currently fluent. No numbness. Denies change in vision. No weakness or loss of normal functional ability. No problems w balance, coordination or gait.       Past Medical History  Diagnosis Date  . Migraine   . History of migraines   . Menopause   . Seasonal allergies   . Cancer (HCC)     cervical  . Abnormal Pap smear 08/2005  . Diverticulosis   . Kidney stones 2012  . PFO (patent foramen ovale)    Past Surgical History  Procedure Laterality Date  . Total abdominal hysterectomy      cervical cancer  . Partial mastectomy  2008  . Tubal ligation  1988  . Bunionectomy  1987  . US echocardiography  03/21/2008    Trace MR,TR & PR, positive saline contrast bubble study  . Nm myocar perf wall motion  02/28/2008    normal   Family History  Problem Relation Age of Onset  . Heart disease Father     heart attack  . Hypertension Father   . Heart disease Mother     heart attack   Social History  Substance Use Topics  . Smoking status: Never Smoker   . Smokeless tobacco: Never Used  . Alcohol Use: Yes   Comment: rare / wine   OB History    Gravida Para Term Preterm AB TAB SAB Ectopic Multiple Living   3 3 3       3      Review of Systems  Constitutional: Negative for fever and chills.  HENT: Negative for trouble swallowing.   Eyes: Negative for visual disturbance.  Respiratory: Negative for shortness of breath.   Cardiovascular: Negative for chest pain and palpitations.  Gastrointestinal: Negative for vomiting and abdominal pain.  Genitourinary: Negative for flank pain.  Musculoskeletal: Negative for back pain and neck pain.  Skin: Negative for rash.  Neurological: Positive for speech difficulty and numbness. Negative for weakness and headaches.  Hematological: Does not bruise/bleed easily.  Psychiatric/Behavioral: Negative for confusion.      Allergies  Codeine; Morphine and related; and Sulfa antibiotics  Home Medications   Prior to Admission medications   Medication Sig Start Date End Date Taking? Authorizing Provider  amLODipine (NORVASC) 2.5 MG tablet Take 1 tablet (2.5 mg total) by mouth daily. 08/22/15  Yes Mihai Croitoru, MD  Ascorbic Acid (VITAMIN C) 1000 MG tablet Take 1,000 mg by mouth every evening.   Yes Historical Provider, MD  Calcium Carbonate-Vitamin D (CALCIUM + D PO) Take 1 tablet by mouth every evening.    Yes Historical Provider, MD  esomeprazole (NEXIUM) 20 MG capsule Take 20 mg by mouth daily at 12 noon.   Yes Historical Provider, MD  esomeprazole (NEXIUM) 40 MG capsule Take 40 mg by mouth every morning.    Yes Historical Provider, MD  Investigational - Study Medication Inject 1 application into the muscle every 30 (thirty) days. Additional Study [id number] drug trial for migraine headaches   Yes Historical Provider, MD  Multiple Vitamin (MULTIVITAMIN WITH MINERALS) TABS tablet Take 1 tablet by mouth daily.   Yes Historical Provider, MD  nebivolol (BYSTOLIC) 10 MG tablet TAKE 1 TABLET (10 MG TOTAL) BY MOUTH DAILY. Patient taking differently: Take 10  mg by mouth every morning. TAKE 1 TABLET (10 MG TOTAL) BY MOUTH DAILY. 02/24/15  Yes Mihai Croitoru, MD  PREMARIN 1.25 MG tablet Take 1.25 mg by mouth daily. 12/09/14  Yes Historical Provider, MD  Probiotic Product (PROBIOTIC FORMULA) CAPS Take 1 capsule by mouth every evening.   Yes Historical Provider, MD  SUMAtriptan (IMITREX) 100 MG tablet Take 1 tablet (100 mg total) by mouth as needed. Patient taking differently: Take 50 mg by mouth as needed.  10/28/14 02/18/16 Yes Paticia Stack, PA-C  Multiple Vitamin (MULTIVITAMIN PO) Take by mouth.      Historical Provider, MD  promethazine (PHENERGAN) 25 MG suppository Place 1 suppository (25 mg total) rectally every 6 (six) hours as needed for nausea. 11/05/14 02/26/16  Collene Leyden Teague Clark, PA-C   BP 147/82 mmHg  Pulse 62  Temp(Src) 97.9 F (36.6 C) (Oral)  Resp 18  SpO2 99% Physical Exam  Constitutional: She is oriented to person, place, and time. She appears well-developed and well-nourished. No distress.  HENT:  Mouth/Throat: Oropharynx is clear and moist.  Eyes: Conjunctivae and EOM are normal. Pupils are equal, round, and reactive to light. No scleral icterus.  Neck: Neck supple. No tracheal deviation present.  No bruit  Cardiovascular: Normal rate, regular rhythm, normal heart sounds and intact distal pulses.  Exam reveals no gallop and no friction rub.   No murmur heard. Pulmonary/Chest: Effort normal and breath sounds normal. No respiratory distress.  Abdominal: Soft. Normal appearance and bowel sounds are normal. She exhibits no distension and no mass. There is no tenderness. There is no rebound and no guarding.  Musculoskeletal: She exhibits no edema or tenderness.  Neurological: She is alert and oriented to person, place, and time. No cranial nerve deficit.  Speech clear/fluent. No facial droop or asymmetry. no pronator drift. Motor intact bil stre 5/5. sens grossly intact. Steady gait.   Skin: Skin is warm and dry. No rash noted.  She is not diaphoretic.  Psychiatric: She has a normal mood and affect.  Nursing note and vitals reviewed.   ED Course  Procedures (including critical care time) Labs Review      I have personally reviewed and evaluated these images and lab results as part of my medical decision-making.   EKG Interpretation   Date/Time:  Monday October 20 2015 15:20:51 EDT Ventricular Rate:  61 PR Interval:  156 QRS Duration: 94 QT Interval:  406 QTC Calculation: 409 R Axis:   43 Text Interpretation:  Sinus rhythm Nonspecific T wave abnormality  Confirmed by Ashok Cordia  MD, Lennette Bihari ([id number]) on 10/20/2015 3:34:02 PM      MDM   Iv ns. Continuous pulse ox and monitor. Labs. Ct.  Neurology consulted.  Reviewed nursing notes and prior charts for additional history.   Neurology indicates mri, and if neg d/c to home (note  below):  SABRIANA BIERNACKI is an 60 y.o. female patient who presented with with intermittent episodes of slurred speech and difficulty getting some words out occurring as multiple transient spells since this morning at 9 AM. Her neurologic examination is normal, nonfocal, with NIHSS 0. No indication at this time for IV TPA. CT of the head was unremarkable for acute pathology.  She has very frequent chronic migraines as described above, currently in a clinical trial for monoclonal antibodies for migraine prevention. She denies any migraine headache today.  Given her age, with vascular risk factors for hypertension, recommend further neurodiagnostic evaluation with an MRI of the brain with and without contrast to rule out intracranial pathology contributing to this. Since she is on a clinical trial with monoclonal antibodies, a contrast MRI has been ordered for completion of assessment. If MRI brain is negative, she can be discharged home from the ER for follow-up with her regular outpatient provider.  If brain MRI is positive for any acute pathology, then she should be admitted for  further neurological workup as deemed necessary. Discussed with ER physician.     Recheck, no return of speech symptoms, no numbness.   Small acute punctate cvas noted on mri.  Will admit to med service.      Lajean Saver, MD 10/20/15 2130

## 2015-10-20 NOTE — Telephone Encounter (Signed)
New message  Pt c/o medication issue:  1. Name of Medication:amLODipine (NORVASC) 2.5 MG tablet  4. What is your medication issue?pt has experienced slurred speech . Not sure if that is a side effect or if she would need to talk with someone

## 2015-10-20 NOTE — Consult Note (Signed)
Requesting Physician: Dr.  Rex Kras    Reason for consultation:  To evaluate speech problems, rule out stroke  HPI:                                                                                                                                         Raven [name] is an 60 y.o. female patient who  was brought into the emergency room for further evaluation of speech problems she started having this morning. They've been occurring intermittently where she would slur and could not get certain words out properly. This type of slurring of the speech lasted couple of minutes at a time and happen multiple times throughout this morning. She did not have any trouble understanding her husband. Intermittent in between the episodes of slurred speech, she had fluent speech and able to speak full sentences without problem. Denies any other neurological symptoms in addition to this, no numbness in her upper or lower extremities or on the face. She did have some subjective paresthesias in the chin bilaterally but when she touches the fingers she did not have any numbness. No vision problems no motor weakness or gait difficulty or  no vertigo. She never had any strokes before.  She has very frequent migraines occurring 3-4 times a week and she has been in a clinical trial for migraine prophylactic therapy using monoclonal antibodies for the past 11 months. This is once a month injection of monoclonal antibodies. She has not noticed a significant improvement in her migraine frequency while she was on the trial. Otherwise she does not take any other prophylactic medication for migraine. She is on a couple of blood pressure pills. She takes Imitrex as needed. She hasn't taken Imitrex Imitrex this morning. She denies any headache or migraine symptoms this morning prior to onset of her sleep symptoms. She denies any headache at this time.   Date last known well:  2017, April 3rd Time last known well:  At  9 AM tPA Given:  No: Nonfocal neurological exam  Stroke Risk Factors - hypertension  Past Medical History: Past Medical History  Diagnosis Date  . Migraine   . History of migraines   . Menopause   . Seasonal allergies   . Cancer (HCC)     cervical  . Abnormal Pap smear 08/2005  . Diverticulosis   . Kidney stones 2012  . PFO (patent foramen ovale)     Past Surgical History  Procedure Laterality Date  . Total abdominal hysterectomy      cervical cancer  . Partial mastectomy  2008  . Tubal ligation  1988  . Bunionectomy  1987  . US echocardiography  03/21/2008    Trace MR,TR & PR, positive saline contrast bubble study  . Nm myocar perf wall motion  02/28/2008    normal    Family History: Family History  Problem Relation Age of Onset  .  Heart disease Father     heart attack  . Hypertension Father   . Heart disease Mother     heart attack    Social History:   reports that she has never smoked. She has never used smokeless tobacco. She reports that she drinks alcohol. She reports that she does not use illicit drugs.  Allergies:  Allergies  Allergen Reactions  . Morphine And Related Nausea Only and Other (See Comments)    "not well tolerated"  . Codeine Nausea Only and Other (See Comments)    "not well tolerated"  . Sulfa Antibiotics Itching     Medications:                                                                                                                         Current facility-administered medications:  .  0.9 %  sodium chloride infusion, , Intravenous, Continuous, Lajean Saver, MD .  LORazepam (ATIVAN) injection 1 mg, 1 mg, Intravenous, Once, Lajean Saver, MD  Current outpatient prescriptions:  .  amLODipine (NORVASC) 2.5 MG tablet, Take 1 tablet (2.5 mg total) by mouth daily., Disp: 90 tablet, Rfl: 3 .  Ascorbic Acid (VITAMIN C) 1000 MG tablet, Take 1,000 mg by mouth every evening., Disp: , Rfl:  .  Calcium Carbonate-Vitamin D (CALCIUM + D PO), Take 1 tablet by  mouth every evening. , Disp: , Rfl:  .  esomeprazole (NEXIUM) 20 MG capsule, Take 20 mg by mouth daily at 12 noon., Disp: , Rfl:  .  Investigational - Study Medication, Inject 1 application into the muscle every 30 (thirty) days. Additional Study [id number] drug trial for migraine headaches, Disp: , Rfl:  .  Multiple Vitamin (MULTIVITAMIN WITH MINERALS) TABS tablet, Take 1 tablet by mouth daily., Disp: , Rfl:  .  nebivolol (BYSTOLIC) 10 MG tablet, TAKE 1 TABLET (10 MG TOTAL) BY MOUTH DAILY. (Patient taking differently: Take 10 mg by mouth every morning. TAKE 1 TABLET (10 MG TOTAL) BY MOUTH DAILY.), Disp: 90 tablet, Rfl: 3 .  PREMARIN 1.25 MG tablet, Take 1.25 mg by mouth daily., Disp: , Rfl: 3 .  Probiotic Product (PROBIOTIC FORMULA) CAPS, Take 1 capsule by mouth every evening., Disp: , Rfl:  .  SUMAtriptan (IMITREX) 100 MG tablet, Take 1 tablet (100 mg total) by mouth as needed. (Patient taking differently: Take 50 mg by mouth as needed. ), Disp: 18 tablet, Rfl: 11 .  Multiple Vitamin (MULTIVITAMIN PO), Take by mouth.  , Disp: , Rfl:  .  promethazine (PHENERGAN) 25 MG suppository, Place 1 suppository (25 mg total) rectally every 6 (six) hours as needed for nausea., Disp: 12 each, Rfl: 3   ROS:  History obtained from the patient  General ROS: negative for - chills, fatigue, fever, night sweats, weight gain or weight loss Psychological ROS: negative for - behavioral disorder, hallucinations, memory difficulties, mood swings or suicidal ideation Ophthalmic ROS: negative for - blurry vision, double vision, eye pain or loss of vision ENT ROS: negative for - epistaxis, nasal discharge, oral lesions, sore throat, tinnitus or vertigo Allergy and Immunology ROS: negative for - hives or itchy/watery eyes Hematological and Lymphatic ROS: negative for - bleeding  problems, bruising or swollen lymph nodes Endocrine ROS: negative for - galactorrhea, hair pattern changes, polydipsia/polyuria or temperature intolerance Respiratory ROS: negative for - cough, hemoptysis, shortness of breath or wheezing Cardiovascular ROS: negative for - chest pain, dyspnea on exertion, edema or irregular heartbeat Gastrointestinal ROS: negative for - abdominal pain, diarrhea, hematemesis, nausea/vomiting or stool incontinence Genito-Urinary ROS: negative for - dysuria, hematuria, incontinence or urinary frequency/urgency Musculoskeletal ROS: negative for - joint swelling or muscular weakness Neurological ROS: as noted in HPI Dermatological ROS: negative for rash and skin lesion changes  Neurologic Examination:                                                                                                    Today's Vitals   10/20/15 1745 10/20/15 1815 10/20/15 1845 10/20/15 1900  BP: 127/85 136/82 141/83 154/84  Pulse: 61 61 61 72  Temp:      TempSrc:      Resp: 21 19 19 17   SpO2: 100% 97% 99% 90%    Evaluation of higher integrative functions including: Level of alertness: Alert,  Oriented to time, place and person Recent and remote memory - intact   Attention span and concentration  - intact   Speech: fluent, no evidence of dysarthria or aphasia noted.  Test the following cranial nerves: 2-12 grossly intact Motor examination: Normal tone, bulk, full 5/5 motor strength in all 4 extremities Examination of sensation : Normal and symmetric sensation to pinprick in all 4 extremities and on face Examination of deep tendon reflexes: 2+, normal and symmetric in all extremities, normal plantars bilaterally Test coordination: Normal finger nose testing, with no evidence of limb appendicular ataxia or abnormal involuntary movements or tremors noted.       Lab Results: Basic Metabolic Panel:  Recent Labs Lab 10/20/15 1555  NA 138  K 3.5  CL 104  CO2 24  GLUCOSE  120*  BUN 14  CREATININE 0.99  CALCIUM 8.9    Liver Function Tests:  Recent Labs Lab 10/20/15 1555  AST 26  ALT 17  ALKPHOS 65  BILITOT 0.5  PROT 6.3*  ALBUMIN 3.7   No results for input(s): LIPASE, AMYLASE in the last 168 hours. No results for input(s): AMMONIA in the last 168 hours.  CBC:  Recent Labs Lab 10/20/15 1555  WBC 7.2  HGB 14.0  HCT 41.6  MCV 90.8  PLT 181    Cardiac Enzymes: No results for input(s): CKTOTAL, CKMB, CKMBINDEX, TROPONINI in the last 168 hours.  Lipid Panel: No results for input(s): CHOL, TRIG, HDL, CHOLHDL, VLDL, LDLCALC in  the last 168 hours.  CBG: No results for input(s): GLUCAP in the last 168 hours.  Microbiology: No results found for this or any previous visit.   Imaging: Ct Head Wo Contrast  10/20/2015  CLINICAL DATA:  60 year old female with history of migraines. Slurred speech onset 10 a.m. this morning. Numbness face and lips. Initial encounter. EXAM: CT HEAD WITHOUT CONTRAST TECHNIQUE: Contiguous axial images were obtained from the base of the skull through the vertex without intravenous contrast. COMPARISON:  None. FINDINGS: No intracranial hemorrhage or CT evidence of large acute infarct. Question of mild chronic small vessel disease changes. No intracranial mass lesion noted on this unenhanced exam. No hydrocephalus. Vascular calcifications. Visualized orbital structures unremarkable. Mastoid air cells, middle ear cavities and visualized paranasal sinuses are clear. IMPRESSION: No intracranial hemorrhage or CT evidence of large acute infarct. Question mild chronic small vessel disease changes. Electronically Signed   By: Genia Del M.D.   On: 10/20/2015 17:15     Stroke Scales   NIHSS :  0  [place] Stroke Program Early CT score (ASPECTS) : 10  Pre stroke Modified Rankin Scale (mRS): 0   Assessment and plan:   Raven [name] is an 60 y.o. female patient who presented with with intermittent episodes of slurred  speech and difficulty getting some words out occurring as multiple transient spells since this morning at 9 AM. Her neurologic examination is normal, nonfocal, with NIHSS 0. No indication at this time for IV TPA. CT of the head was unremarkable for acute pathology.  She has very frequent chronic migraines as described above, currently in a clinical trial for monoclonal antibodies for migraine prevention. She denies any migraine headache today.  Given her age, with vascular risk factors for hypertension, recommend further neurodiagnostic evaluation with an MRI of the brain with and without contrast to rule out intracranial pathology contributing to this. Since she is on a clinical trial with monoclonal antibodies, a contrast MRI has been ordered for completion of assessment. If MRI brain is negative, she can be discharged home from the ER for follow-up with her regular outpatient provider.  If brain MRI is positive for any acute pathology, then she should be admitted for further neurological workup as deemed necessary. Discussed with ER physician.

## 2015-10-21 ENCOUNTER — Other Ambulatory Visit (HOSPITAL_COMMUNITY): Payer: PRIVATE HEALTH INSURANCE

## 2015-10-21 ENCOUNTER — Ambulatory Visit: Payer: PRIVATE HEALTH INSURANCE

## 2015-10-21 ENCOUNTER — Inpatient Hospital Stay (HOSPITAL_COMMUNITY): Payer: Commercial Managed Care - PPO

## 2015-10-21 ENCOUNTER — Encounter (HOSPITAL_COMMUNITY): Payer: Self-pay | Admitting: Radiology

## 2015-10-21 DIAGNOSIS — I10 Essential (primary) hypertension: Secondary | ICD-10-CM

## 2015-10-21 DIAGNOSIS — G43001 Migraine without aura, not intractable, with status migrainosus: Secondary | ICD-10-CM

## 2015-10-21 DIAGNOSIS — I6789 Other cerebrovascular disease: Secondary | ICD-10-CM

## 2015-10-21 DIAGNOSIS — I639 Cerebral infarction, unspecified: Secondary | ICD-10-CM

## 2015-10-21 LAB — CBC WITH DIFFERENTIAL/PLATELET
BASOS ABS: 0 10*3/uL (ref 0.0–0.1)
BASOS PCT: 0 %
EOS ABS: 0.1 10*3/uL (ref 0.0–0.7)
Eosinophils Relative: 2 %
HEMATOCRIT: 39.4 % (ref 36.0–46.0)
HEMOGLOBIN: 13 g/dL (ref 12.0–15.0)
Lymphocytes Relative: 35 %
Lymphs Abs: 2 10*3/uL (ref 0.7–4.0)
MCH: 30.1 pg (ref 26.0–34.0)
MCHC: 33 g/dL (ref 30.0–36.0)
MCV: 91.2 fL (ref 78.0–100.0)
Monocytes Absolute: 0.5 10*3/uL (ref 0.1–1.0)
Monocytes Relative: 8 %
NEUTROS ABS: 3.2 10*3/uL (ref 1.7–7.7)
NEUTROS PCT: 55 %
Platelets: 164 10*3/uL (ref 150–400)
RBC: 4.32 MIL/uL (ref 3.87–5.11)
RDW: 12.8 % (ref 11.5–15.5)
WBC: 5.9 10*3/uL (ref 4.0–10.5)

## 2015-10-21 LAB — LIPID PANEL
CHOL/HDL RATIO: 3.5 ratio
Cholesterol: 170 mg/dL (ref 0–200)
HDL: 48 mg/dL (ref >40)
LDL CALC: 98 mg/dL (ref 0–99)
Triglycerides: 119 mg/dL (ref <150)
VLDL: 24 mg/dL (ref 0–40)

## 2015-10-21 LAB — COMPREHENSIVE METABOLIC PANEL
ALBUMIN: 3.2 g/dL — AB (ref 3.5–5.0)
ALK PHOS: 59 U/L (ref 38–126)
ALT: 15 U/L (ref 14–54)
ANION GAP: 9 (ref 5–15)
AST: 17 U/L (ref 15–41)
BUN: 12 mg/dL (ref 6–20)
CALCIUM: 8.4 mg/dL — AB (ref 8.9–10.3)
CO2: 24 mmol/L (ref 22–32)
Chloride: 105 mmol/L (ref 101–111)
Creatinine, Ser: 1.03 mg/dL — ABNORMAL HIGH (ref 0.44–1.00)
GFR calc Af Amer: >60 mL/min (ref >60)
GFR calc non Af Amer: 58 mL/min — ABNORMAL LOW (ref >60)
GLUCOSE: 85 mg/dL (ref 65–99)
Potassium: 3.4 mmol/L — ABNORMAL LOW (ref 3.5–5.1)
SODIUM: 138 mmol/L (ref 135–145)
Total Bilirubin: 0.4 mg/dL (ref 0.3–1.2)
Total Protein: 5.6 g/dL — ABNORMAL LOW (ref 6.5–8.1)

## 2015-10-21 MED ORDER — AMLODIPINE BESYLATE 2.5 MG PO TABS
2.5000 mg | ORAL_TABLET | Freq: Every day | ORAL | Status: DC
  Administered 2015-10-21: 2.5 mg via ORAL
  Filled 2015-10-21: qty 1

## 2015-10-21 MED ORDER — IOPAMIDOL (ISOVUE-370) INJECTION 76%
INTRAVENOUS | Status: AC
  Administered 2015-10-21: 50 mL
  Filled 2015-10-21: qty 50

## 2015-10-21 MED ORDER — ATORVASTATIN CALCIUM 10 MG PO TABS
10.0000 mg | ORAL_TABLET | Freq: Every day | ORAL | Status: DC
  Administered 2015-10-21 – 2015-10-22 (×2): 10 mg via ORAL
  Filled 2015-10-21 (×2): qty 1

## 2015-10-21 MED ORDER — ASPIRIN EC 325 MG PO TBEC
325.0000 mg | DELAYED_RELEASE_TABLET | Freq: Every day | ORAL | Status: DC
  Administered 2015-10-21: 325 mg via ORAL
  Filled 2015-10-21: qty 1

## 2015-10-21 MED ORDER — ADULT MULTIVITAMIN W/MINERALS CH
1.0000 | ORAL_TABLET | Freq: Every day | ORAL | Status: DC
  Administered 2015-10-21: 1 via ORAL
  Filled 2015-10-21: qty 1

## 2015-10-21 MED ORDER — NEBIVOLOL HCL 10 MG PO TABS
10.0000 mg | ORAL_TABLET | Freq: Every morning | ORAL | Status: DC
  Administered 2015-10-21: 10 mg via ORAL
  Filled 2015-10-21 (×3): qty 1

## 2015-10-21 MED ORDER — PANTOPRAZOLE SODIUM 40 MG PO TBEC
40.0000 mg | DELAYED_RELEASE_TABLET | Freq: Every day | ORAL | Status: DC
Start: 2015-10-21 — End: 2015-10-22
  Administered 2015-10-21: 40 mg via ORAL
  Filled 2015-10-21: qty 1

## 2015-10-21 NOTE — Care Management Note (Signed)
Case Management Note  Patient Details  Name: Raven [name] MRN: [id number] Date of Birth: [birth year]  Subjective/Objective:                    Action/Plan: Patient was admitted with CVA. Lives at home with spouse. Will follow for discharge needs pending PT/OT evals and physician orders.  Expected Discharge Date:                  Expected Discharge Plan:     In-House Referral:     Discharge planning Services     Post Acute Care Choice:    Choice offered to:     DME Arranged:    DME Agency:     HH Arranged:    HH Agency:     Status of Service:  In process, will continue to follow  Medicare Important Message Given:    Date Medicare IM Given:    Medicare IM give by:    Date Additional Medicare IM Given:    Additional Medicare Important Message give by:     If discussed at [place] of Stay Meetings, dates discussed:    Additional CommentsRolm Baptise, RN 10/21/2015, 8:23 AM [phone]

## 2015-10-21 NOTE — Progress Notes (Signed)
Preliminary results by tech - Carotid Duplex Completed. No evidence of a significant stenosis in bilateral carotid arteries. Vertebral arteries demonstrate antegrade flow. Oda Cogan, BS, RDMS, RVT

## 2015-10-21 NOTE — Progress Notes (Signed)
STROKE TEAM PROGRESS NOTE   HISTORY OF PRESENT ILLNESS Raven [name] is an 60 y.o. female patient who was brought into the emergency room for further evaluation of speech problems she started having this morning. They've been occurring intermittently where she would slur and could not get certain words out properly. This type of slurring of the speech lasted couple of minutes at a time and happen multiple times throughout this morning. She did not have any trouble understanding her husband. Intermittent in between the episodes of slurred speech, she had fluent speech and able to speak full sentences without problem. Denies any other neurological symptoms in addition to this, no numbness in her upper or lower extremities or on the face. She did have some subjective paresthesias in the chin bilaterally but when she touches the fingers she did not have any numbness. No vision problems no motor weakness or gait difficulty or no vertigo. She never had any strokes before.   She has very frequent migraines occurring 3-4 times a week and she has been in a clinical trial for migraine prophylactic therapy using monoclonal antibodies for the past 11 months. This is once a month injection of monoclonal antibodies. She has not noticed a significant improvement in her migraine frequency while she was on the trial. Otherwise she does not take any other prophylactic medication for migraine. She is on a couple of blood pressure pills. She takes Imitrex as needed. She hasn't taken Imitrex Imitrex this morning. She denies any headache or migraine symptoms this morning prior to onset of her sleep symptoms. She denies any headache at this time.   She was last known well for 10/06/2015 at 9 AM. Patient was not administered IV t-PA secondary to Nonfocal neurological exam. She was admitted for further evaluation and treatment.   SUBJECTIVE (INTERVAL HISTORY) Her daughter is at the bedside.  She recounted her HHPI with  Dr. Leonie Man - describing expressive aphasia. Denies prior stroke or TIA.  She has known PFO. She requested that her home medications be renewed. Overall she feels her condition is stable. She is anxious to go home, hoping it will be today. She has no prior history of TIA, stroke. She is participating in a migraine trial with a monoclonal antibody. She has no history of DVTs, pulmonary embolism, atrial fibrillation or syncope   OBJECTIVE Temp:  [97.7 F (36.5 C)-98.3 F (36.8 C)] 98.1 F (36.7 C) (04/04 1200) Pulse Rate:  [57-109] 70 (04/04 1200) Cardiac Rhythm:  [-] Normal sinus rhythm (04/04 0700) Resp:  [15-23] 18 (04/04 1200) BP: (104-156)/(65-95) 156/95 mmHg (04/04 1200) SpO2:  [90 %-100 %] 96 % (04/04 1200) Weight:  [84.505 kg (186 lb 4.8 oz)] 84.505 kg (186 lb 4.8 oz) (04/03 2325)  CBC:   Recent Labs Lab 10/20/15 1555 10/21/15 0343  WBC 7.2 5.9  NEUTROABS  --  3.2  HGB 14.0 13.0  HCT 41.6 39.4  MCV 90.8 91.2  PLT 181 [id number]    Basic Metabolic Panel:   Recent Labs Lab 10/20/15 1555 10/21/15 0343  NA 138 138  K 3.5 3.4*  CL 104 105  CO2 24 24  GLUCOSE 120* 85  BUN 14 12  CREATININE 0.99 1.03*  CALCIUM 8.9 8.4*    Lipid Panel:     Component Value Date/Time   CHOL 170 10/21/2015 0343   TRIG 119 10/21/2015 0343   HDL 48 10/21/2015 0343   CHOLHDL 3.5 10/21/2015 0343   VLDL 24 10/21/2015 0343   LDLCALC 98  10/21/2015 0343   HgbA1c: No results found for: HGBA1C Urine Drug Screen: No results found for: LABOPIA, COCAINSCRNUR, LABBENZ, AMPHETMU, THCU, LABBARB    IMAGING  Ct Head Wo Contrast 10/20/2015  No intracranial hemorrhage or CT evidence of large acute infarct. Question mild chronic small vessel disease changes.   CT HEAD  10/21/2015   MR detected left middle cerebral artery distribution small infarcts are not as well delineated on the present CT.  CTA NECK  10/21/2015   Minimal bulge medial aspect proximal descending thoracic aorta/aortic arch without focal  ulceration. Plaque carotid bifurcation bilaterally extending into proximal right internal carotid arteries with less than 50% diameter narrowing bilaterally. No significant stenosis of the dominant right vertebral artery. Mild narrowing with moderate ectasia proximal left vertebral artery. Pleated appearance of the vertebral arteries at C2 level may be caused by dental artifact but cannot exclude fibromuscular dysplasia.   CTA HEAD  10/21/2015   Plaque cavernous segment internal carotid artery bilaterally with mild narrowing otherwise anterior circulation without medium or large size vessel significant stenosis or occlusion. Right vertebral artery is dominant. Vertebral arteries are ectatic as is the basilar artery without high-grade stenosis.   Mr Kizzie Fantasia Contrast 10/20/2015  1. Several small acute posterior left MCA territory cortical and white matter infarcts with no associated hemorrhage or mass effect. 2. Otherwise negative for age MRI appearance of the brain.   2D Echocardiogram  - Left ventricle: The cavity size was normal. There was mild focal basal hypertrophy of the septum. Systolic function was normal. The estimated ejection fraction was in the range of 55% to 60%. Wall motion was normal; there were no regional wall motion abnormalities. Doppler parameters are consistent with abnormal left ventricular relaxation (grade 1 diastolic dysfunction). - Aortic valve: There was trivial regurgitation. Impressions:   Normal LV systolic function; grade 1 diastolic dysfunction; trace AI and MR; mild TR.  Carotid Doppler   No evidence of a significant stenosis in bilateral carotid arteries. Vertebral arteries demonstrate antegrade flow.  Lower extremity Doppler's pending    PHYSICAL EXAM Pleasant middle-age Caucasian lady not in distress. . Afebrile. Head is nontraumatic. Neck is supple without bruit.    Cardiac exam no murmur or gallop. Lungs are clear to auscultation. Distal pulses are well  felt. Neurological Exam ;  Awake  Alert oriented x 3. Normal speech and language.eye movements full without nystagmus.fundi were not visualized. Vision acuity and fields appear normal. Hearing is normal. Palatal movements are normal. Face symmetric. Tongue midline. Normal strength, tone, reflexes and coordination. Normal sensation. Gait deferred. ASSESSMENT/PLAN [name] is a 60 y.o. female with history of migraines, known PFO presenting with slurred speech with expressive aphasia. She did not receive IV t-PA due to nonfocal neurologic exam.   Stroke:  Left MCA territory cortical and subcortical infarcts embolic secondary to unknown source  MRI  left MCA territory cortical and subcortical infarcts   CT angiogram head no significant stenosis requiring intervention  CT angiogram neck R ICA < 50% stenosis, no significant stenosis requiring intervention  Carotid Doppler  pending   2D Echo  No source of embolus  LE venous dopplers pending   TEE to look for embolic source. As this is a pt of [facility], will ask him to do. If unable, will ask [place]. Plan for TEE tomorrow.  (I have made patient NPO after midnight tonight).   If TEE and lower extremity doppler's are negative, a [facility] electrophysiologist will consult and consider placement of an implantable loop recorder to evaluate for atrial fibrillation as etiology of stroke. This has been explained to patient/family by Dr. Leonie Man and they are agreeable.   LDL 98  HgbA1c pending  Lovenox 40 mg sq daily for VTE prophylaxis Diet Heart Room service appropriate?: Yes; Fluid consistency:: Thin  No antithrombotic prior to admission, received 1 dose of aspirin 324 mg yesterday (4/3), now on No antithrombotic. Added aspirin 325 mg daily to start today  Patient counseled to be compliant with her antithrombotic medications  Ongoing aggressive stroke risk factor  management  Therapy recommendations:  No PT  Disposition:  Anticipate return home  Hyperlipidemia  Home meds:  No statin  LDL 98, goal < 70  Added statin  Continue statin at discharge  Other Stroke Risk Factors  ETOH use  Migraines, enrolled in clinical trial where she receives injections. A monoclonal antibodies once a month  PFO  Menopausal on daily Premarin  Resumed home medications with Dr. Sherral Hammers approval  [facility] day # [place] for Pager information 10/21/2015 3:14 PM  I have personally examined this patient, reviewed notes, independently viewed imaging studies, participated in medical decision making and plan of care. I have made any additions or clarifications directly to the above note. Agree with note above. She presented with transient expressive aphasia and facial numbness which appears to have improved. MRI scan shows left frontal embolic infarct etiology to be determined. She remains at risk for neurological worsening, recurrent stroke, TIA and needs ongoing stroke evaluation. I'm unclear as to relation ship between her migraine trial medication and this stroke. Plan to check TEE and loop recorder. Long discussion with patient and daughter at the bedside and answered questions  Antony Contras, MD Medical Director [place] Pager: [phone] 10/21/2015 3:19 PM    To contact Stroke Continuity provider, please refer to [email or web address] After hours, contact General Neurology

## 2015-10-21 NOTE — Evaluation (Signed)
Physical Therapy Evaluation Patient Details Name: Raven [name] MRN: [id number] DOB: 1955-07-26 Today's Date: Nov 07, 2015   History of Present Illness  Pt admitted to ER with c/o slurred speech  Clinical Impression  Pt is independent with all mobility, no further PT needs identified at this time.    Follow Up Recommendations No PT follow up    Equipment Recommendations  None recommended by PT    Recommendations for Other Services       Precautions / Restrictions Precautions Precautions: None Restrictions Weight Bearing Restrictions: No      Mobility  Bed Mobility Overal bed mobility: Independent                Transfers Overall transfer level: Independent                  Ambulation/Gait Ambulation/Gait assistance: Independent Ambulation Distance (Feet): 250 Feet Assistive device: None          Stairs            Wheelchair Mobility    Modified Rankin (Stroke Patients Only)       Balance       Sitting balance - Comments: independent during functional activities of dressing, toileting, grooming                                     Pertinent Vitals/Pain Pain Assessment: No/denies pain    Home Living Family/patient expects to be discharged to:: Private residence Living Arrangements: Spouse/significant other Available Help at Discharge: Family Type of Home: House                Prior Function Level of Independence: Independent               Hand Dominance        Extremity/Trunk Assessment   Upper Extremity Assessment: Overall WFL for tasks assessed           Lower Extremity Assessment: Overall WFL for tasks assessed      Cervical / Trunk Assessment: Normal  Communication   Communication: No difficulties  Cognition     Overall Cognitive Status: Within Functional Limits for tasks assessed                      General Comments      Exercises        Assessment/Plan     PT Assessment [place] does not need any further PT services  PT Diagnosis     PT Problem List    PT Treatment Interventions     PT Goals (Current goals can be found in the Care Plan section) Acute Rehab PT Goals PT Goal Formulation: All assessment and education complete, DC therapy    Frequency     Barriers to discharge        Co-evaluation               End of Session   Activity Tolerance: Patient tolerated treatment well Patient left: in bed;with call bell/phone within reach           Time: [id number] PT Time Calculation (min) (ACUTE ONLY): 10 min   Charges:   PT Evaluation $PT Eval Low Complexity: 1 Procedure     PT G Codes:        Raven [name] 07-Nov-2015, 7:55 AM

## 2015-10-21 NOTE — Progress Notes (Signed)
[facility] PROGRESS NOTE  Raven [name] [id number] DOB: July 31, 1955 DOA: 10/20/2015 PCP: Thressa Sheller, MD Admit HPI / Brief Narrative: Raven [name] is a 60 y.o.WF PMHx HTN, PFO, Breast cancer, Cervical cancer, Migraine headaches, seasonal allergies, urolithiasis who comes to the emergency department due to outflow chief complaint.  Per patient, she was on the phone this morning discussing something with a lawyer when she experienced sudden slurred speech and difficulty finding/articulating words. She is states that she finished that conversation very slowly, but had multiple recurrences during the day. She subsequently experienced tingling on her chin and left side of face in the afternoon. She called her cardiologist's office, who asked her to come to the emergency department for stroke evaluation. She denies headache, blurred vision, dizziness, unilateral motor weakness.   When seen in the emergency department, the patient stated that she did not have any symptoms at that time. Workup reveals imaging consistent with acute CVA.  HPI/Subjective: 4/4  A/O 4, NAD    Assessment/Plan: Acute CVA (cerebrovascular accident) ([place]) Admit to telemetry/inpatient. Frequent neuro checks. Check hemoglobin A1c and fasting lipid panel. Check carotid Dopplers. Check echocardiogram. PT/OT evaluation. -TEE pending  Migraine Asymptomatic at this time. Analgesics as needed. Imitrex as needed.  HTN (hypertension) -Allow permissive HTN secondary to acute CVA SBP goal 145-180  -Monitor blood pressure. -Resume antihypertensive therapy once cleared by neuro.   Code Status: Full Family Communication: None  Disposition Plan: Per neurology   Consultants: PA Leanor Kail, cardiology [place] stroke team  Procedures: 4/3 MR brain W/WO contrast; -Several small acute posterior left MCA territory cortical and white matter infarcts with no associated hemorrhage or  mass effect. 4/4 CT angiogram head/neck :left middle cerebral artery distribution small infarcts are not as well delineated on the present CT.  -Right carotid system: Plaque right carotid bifurcation extending into proximal right internal carotid artery with less than 50% diameter narrowing. Left carotid system: Plaque left carotid bifurcation extending into proximal left internal carotid artery with less than 50% diameter narrowing. 4/4 echocardiogram;LVEF= 0000000 1 diastolic dysfunction).     Cultures NA  Antibiotics: NA   DVT prophylaxis Lovenox    Objective: Filed Vitals:   10/21/15 0600 10/21/15 0800 10/21/15 1000 10/21/15 1200  BP: 113/65 142/91 138/95 156/95  Pulse: 66 61 67 70  Temp: 97.9 F (36.6 C) 97.9 F (36.6 C) 97.9 F (36.6 C) 98.1 F (36.7 C)  TempSrc: Oral Oral Oral Oral  Resp: 18 16 18 18   Height:      Weight:      SpO2: 99% 97% 98% 96%   No intake or output data in the 24 hours ending 10/21/15 1327 Filed Weights   10/20/15 2325  Weight: 84.505 kg (186 lb 4.8 oz)     Exam: General: A/O 4 ,No acute respiratory distress Eyes: Negative headache, double vision,negative scleral hemorrhage ENT: Negative Runny nose, negative gingival bleeding, Neck:  Negative scars, masses, torticollis, lymphadenopathy, JVD Lungs: Clear to auscultation bilaterally without wheezes or crackles Cardiovascular: Regular rate and rhythm without murmur gallop or rub normal S1 and S2 Abdomen:negative abdominal pain, negative dysphagia, nondistended, positive soft, bowel sounds, no rebound, no ascites, no appreciable mass Extremities: No significant cyanosis, clubbing, or edema bilateral lower extremities Psychiatric:  Negative depression, negative anxiety, negative fatigue, negative mania  Neurologic:  Cranial nerves II through XII intact, tongue/uvula midline, all extremities muscle strength 5/5, sensation intact throughout, finger nose finger bilateral within  normal limits, quick finger touch bilateral within normal  limits,  heel to shin bilateral within normal limits,negative dysarthria, negative expressive aphasia, negative receptive aphasia.     Data Reviewed: Basic Metabolic Panel:  Recent Labs Lab 10/20/15 1555 10/21/15 0343  NA 138 138  K 3.5 3.4*  CL 104 105  CO2 24 24  GLUCOSE 120* 85  BUN 14 12  CREATININE 0.99 1.03*  CALCIUM 8.9 8.4*   Liver Function Tests:  Recent Labs Lab 10/20/15 1555 10/21/15 0343  AST 26 17  ALT 17 15  ALKPHOS 65 59  BILITOT 0.5 0.4  PROT 6.3* 5.6*  ALBUMIN 3.7 3.2*   No results for input(s): LIPASE, AMYLASE in the last 168 hours. No results for input(s): AMMONIA in the last 168 hours. CBC:  Recent Labs Lab 10/20/15 1555 10/21/15 0343  WBC 7.2 5.9  NEUTROABS  --  3.2  HGB 14.0 13.0  HCT 41.6 39.4  MCV 90.8 91.2  PLT 181 164   Cardiac Enzymes: No results for input(s): CKTOTAL, CKMB, CKMBINDEX, TROPONINI in the last 168 hours. BNP (last 3 results) No results for input(s): BNP in the last 8760 hours.  ProBNP (last 3 results) No results for input(s): PROBNP in the last 8760 hours.  CBG: No results for input(s): GLUCAP in the last 168 hours.  No results found for this or any previous visit (from the past 240 hour(s)).   Studies: Ct Angio Head W/cm &/or Wo Cm  10/21/2015  CLINICAL DATA:  60 year old hypertensive female with history of migraine headaches presenting with slurred speech and numbness in lips and face. Subsequent encounter. EXAM: CT ANGIOGRAPHY HEAD AND NECK TECHNIQUE: Multidetector CT imaging of the head and neck was performed using the standard protocol during bolus administration of intravenous contrast. Multiplanar CT image reconstructions and MIPs were obtained to evaluate the vascular anatomy. Carotid stenosis measurements (when applicable) are obtained utilizing NASCET criteria, using the distal internal carotid diameter as the denominator. CONTRAST:  50 cc  Omnipaque 370. COMPARISON:  10/20/2015 brain MR. 10/20/2015 head CT. FINDINGS: CT HEAD Brain: MR detected left middle cerebral artery distribution small infarcts are not as well delineated on the present CT. No intracranial hemorrhage. No intracranial enhancing lesion. No hydrocephalus. Calvarium and skull base: Negative. Paranasal sinuses: Clear. Orbits: Negative. CTA NECK Aortic arch: 3 vessel arch with slight ectasia. Minimal bulge medial aspect proximal descending thoracic aorta/aortic arch without focal ulceration. Right carotid system: Plaque right carotid bifurcation extending into proximal right internal carotid artery with less than 50% diameter narrowing. Left carotid system: Plaque left carotid bifurcation extending into proximal left internal carotid artery with less than 50% diameter narrowing. Vertebral arteries:Venous contrast causes artifact proximal right vertebral artery. No significant stenosis of the dominant right vertebral artery. Mild narrowing with moderate ectasia proximal left vertebral artery. Pleated appearance of the vertebral arteries at C2 level may be caused by dental artifact but cannot exclude fibromuscular dysplasia. Skeleton: Mild cervical spondylotic changes C5-6. Other neck: No worrisome neck mass or adenopathy. Visualized lung apices are clear. CTA HEAD Anterior circulation: Plaque cavernous segment internal carotid artery bilaterally mild narrowing otherwise anterior circulation without medium or large size vessel significant stenosis or occlusion. Posterior circulation: Right vertebral artery is dominant. Vertebral arteries are ectatic as is the basilar artery without high-grade stenosis. Venous sinuses: Negative. Anatomic variants: Negative. Delayed phase: As above. IMPRESSION: CT HEAD MR detected left middle cerebral artery distribution small infarcts are not as well delineated on the present CT. CTA NECK Minimal bulge medial aspect proximal descending thoracic aorta/aortic  arch  without focal ulceration. Plaque carotid bifurcation bilaterally extending into proximal right internal carotid arteries with less than 50% diameter narrowing bilaterally. No significant stenosis of the dominant right vertebral artery. Mild narrowing with moderate ectasia proximal left vertebral artery. Pleated appearance of the vertebral arteries at C2 level may be caused by dental artifact but cannot exclude fibromuscular dysplasia. CTA HEAD Plaque cavernous segment internal carotid artery bilaterally with mild narrowing otherwise anterior circulation without medium or large size vessel significant stenosis or occlusion. Right vertebral artery is dominant. Vertebral arteries are ectatic as is the basilar artery without high-grade stenosis. Electronically Signed   By: Genia Del M.D.   On: 10/21/2015 12:44   Ct Head Wo Contrast  10/20/2015  CLINICAL DATA:  60 year old female with history of migraines. Slurred speech onset 10 a.m. this morning. Numbness face and lips. Initial encounter. EXAM: CT HEAD WITHOUT CONTRAST TECHNIQUE: Contiguous axial images were obtained from the base of the skull through the vertex without intravenous contrast. COMPARISON:  None. FINDINGS: No intracranial hemorrhage or CT evidence of large acute infarct. Question of mild chronic small vessel disease changes. No intracranial mass lesion noted on this unenhanced exam. No hydrocephalus. Vascular calcifications. Visualized orbital structures unremarkable. Mastoid air cells, middle ear cavities and visualized paranasal sinuses are clear. IMPRESSION: No intracranial hemorrhage or CT evidence of large acute infarct. Question mild chronic small vessel disease changes. Electronically Signed   By: Genia Del M.D.   On: 10/20/2015 17:15   Ct Angio Neck W/cm &/or Wo/cm  10/21/2015  CLINICAL DATA:  60 year old hypertensive female with history of migraine headaches presenting with slurred speech and numbness in lips and face. Subsequent  encounter. EXAM: CT ANGIOGRAPHY HEAD AND NECK TECHNIQUE: Multidetector CT imaging of the head and neck was performed using the standard protocol during bolus administration of intravenous contrast. Multiplanar CT image reconstructions and MIPs were obtained to evaluate the vascular anatomy. Carotid stenosis measurements (when applicable) are obtained utilizing NASCET criteria, using the distal internal carotid diameter as the denominator. CONTRAST:  50 cc Omnipaque 370. COMPARISON:  10/20/2015 brain MR. 10/20/2015 head CT. FINDINGS: CT HEAD Brain: MR detected left middle cerebral artery distribution small infarcts are not as well delineated on the present CT. No intracranial hemorrhage. No intracranial enhancing lesion. No hydrocephalus. Calvarium and skull base: Negative. Paranasal sinuses: Clear. Orbits: Negative. CTA NECK Aortic arch: 3 vessel arch with slight ectasia. Minimal bulge medial aspect proximal descending thoracic aorta/aortic arch without focal ulceration. Right carotid system: Plaque right carotid bifurcation extending into proximal right internal carotid artery with less than 50% diameter narrowing. Left carotid system: Plaque left carotid bifurcation extending into proximal left internal carotid artery with less than 50% diameter narrowing. Vertebral arteries:Venous contrast causes artifact proximal right vertebral artery. No significant stenosis of the dominant right vertebral artery. Mild narrowing with moderate ectasia proximal left vertebral artery. Pleated appearance of the vertebral arteries at C2 level may be caused by dental artifact but cannot exclude fibromuscular dysplasia. Skeleton: Mild cervical spondylotic changes C5-6. Other neck: No worrisome neck mass or adenopathy. Visualized lung apices are clear. CTA HEAD Anterior circulation: Plaque cavernous segment internal carotid artery bilaterally mild narrowing otherwise anterior circulation without medium or large size vessel significant  stenosis or occlusion. Posterior circulation: Right vertebral artery is dominant. Vertebral arteries are ectatic as is the basilar artery without high-grade stenosis. Venous sinuses: Negative. Anatomic variants: Negative. Delayed phase: As above. IMPRESSION: CT HEAD MR detected left middle cerebral artery distribution small infarcts are not as  well delineated on the present CT. CTA NECK Minimal bulge medial aspect proximal descending thoracic aorta/aortic arch without focal ulceration. Plaque carotid bifurcation bilaterally extending into proximal right internal carotid arteries with less than 50% diameter narrowing bilaterally. No significant stenosis of the dominant right vertebral artery. Mild narrowing with moderate ectasia proximal left vertebral artery. Pleated appearance of the vertebral arteries at C2 level may be caused by dental artifact but cannot exclude fibromuscular dysplasia. CTA HEAD Plaque cavernous segment internal carotid artery bilaterally with mild narrowing otherwise anterior circulation without medium or large size vessel significant stenosis or occlusion. Right vertebral artery is dominant. Vertebral arteries are ectatic as is the basilar artery without high-grade stenosis. Electronically Signed   By: Genia Del M.D.   On: 10/21/2015 12:44   Mr Jeri Cos [id number] Contrast  10/20/2015  CLINICAL DATA:  60 year old female with intermittent slurred speech since this morning. No motor weakness. Initial encounter. EXAM: MRI HEAD WITHOUT AND WITH CONTRAST TECHNIQUE: Multiplanar, multiecho pulse sequences of the brain and surrounding structures were obtained without and with intravenous contrast. CONTRAST:  5mL MULTIHANCE GADOBENATE DIMEGLUMINE 529 MG/ML IV SOLN COMPARISON:  Noncontrast head CT 1654 hours today. FINDINGS: There is a small cortically based infarct along the left peri-Rolandic cortex knee near the area of facial representation on series 4, image 38. There are 1 or 2 additional tiny  punctate subcortical infarcts in the more superior left frontal gyrus (series 4, image 40 and series 6, image 14). Major intracranial vascular flow voids are preserved. No contralateral right hemisphere or posterior fossa restricted diffusion. There is mild T2 and FLAIR hyperintensity associated with the small cortical infarct (series 7, image 19) with no associated hemorrhage or mass effect. Elsewhere gray and white matter signal is normal for age. No chronic cerebral blood products or cortical encephalomalacia identified. No abnormal enhancement identified. No dural thickening. No midline shift, mass effect, evidence of mass lesion, ventriculomegaly, extra-axial collection or acute intracranial hemorrhage. Cervicomedullary junction and pituitary are within normal limits. Negative visualized cervical spine. Visualized bone marrow signal is within normal limits. Visible internal auditory structures appear normal. Mastoids are clear. Paranasal sinuses are clear. Negative orbit and scalp soft tissues. IMPRESSION: 1. Several small acute posterior left MCA territory cortical and white matter infarcts with no associated hemorrhage or mass effect. 2. Otherwise negative for age MRI appearance of the brain. Electronically Signed   By: Genevie Ann M.D.   On: 10/20/2015 21:25    Scheduled Meds: . enoxaparin (LOVENOX) injection  40 mg Subcutaneous Q24H  . sodium chloride flush  3 mL Intravenous Q12H   Continuous Infusions: . sodium chloride      Principal Problem:   Acute CVA (cerebrovascular accident) ([place]) Active Problems:   Migraine   HTN (hypertension)    Time spent: 35 minutes    [place], [facility] Pager [phone]. If 7PM-7AM, please contact night-coverage at [email or web address], password [facility] 10/21/2015, 1:27 PM  LOS: 1 day    Care during the described time interval was provided by me .  I have reviewed this patient's available data, including medical history, events of note, physical  examination, and all test results as part of my evaluation. I have personally reviewed and interpreted all radiology studies.   Dia Crawford, MD [phone] Pager

## 2015-10-21 NOTE — H&P (Signed)
[facility] History and Physical  Raven [name] [id number] DOB: Oct 31, 1955 DOA: 10/20/2015  Referring physician: Lajean Saver, M.D. PCP: Thressa Sheller, MD   Chief Complaint: Slurred speech.  HPI: Raven [name] is a 60 y.o. female with a past medical history hypertension, PFO, breast cancer, cervical cancer, migraine headaches, seasonal allergies, urolithiasis who comes to the emergency department due to outflow chief complaint.  Per patient, she was on the phone this morning discussing something with a lawyer when she experienced sudden slurred speech and difficulty finding/articulating words. She is states that she finished that conversation very slowly, but had multiple recurrences during the day. She subsequently experienced tingling on her chin and  left side of face in the afternoon. She called her cardiologist's office, who asked her to come to the emergency department for stroke evaluation. She denies headache, blurred vision, dizziness, unilateral motor weakness.   When seen in the emergency department, the patient stated that she did not have any symptoms at that time. Workup reveals imaging consistent with acute CVA.   Review of Systems:  Constitutional:  No weight loss, night sweats, Fevers, chills, fatigue.  HEENT:  No headaches, Difficulty swallowing,Tooth/dental problems,Sore throat,  No sneezing, itching, ear ache, nasal congestion, post nasal drip,  Cardio-vascular:  No chest pain, Orthopnea, PND, swelling in lower extremities, anasarca, dizziness, palpitations  GI:  No heartburn, indigestion, abdominal pain, nausea, vomiting, diarrhea, change in bowel habits, loss of appetite  Resp:  No shortness of breath with exertion or at rest. No excess mucus, no productive cough, No non-productive cough, No coughing up of blood.No change in color of mucus.No wheezing.No chest wall deformity  Skin:  no rash or lesions.  GU:  no dysuria, change in color of  urine, no urgency or frequency. No flank pain.  Musculoskeletal:  No joint pain or swelling. No decreased range of motion. No back pain.  Psych:  No change in mood or affect. No depression or anxiety. No memory loss.  Neuro: As above mentioned. Past Medical History  Diagnosis Date  . Migraine   . History of migraines   . Menopause   . Seasonal allergies   . Cancer (HCC)     cervical  . Abnormal Pap smear 08/2005  . Diverticulosis   . Kidney stones 2012  . PFO (patent foramen ovale)    Past Surgical History  Procedure Laterality Date  . Total abdominal hysterectomy      cervical cancer  . Partial mastectomy  2008  . Tubal ligation  1988  . Bunionectomy  1987  . US echocardiography  03/21/2008    Trace MR,TR & PR, positive saline contrast bubble study  . Nm myocar perf wall motion  02/28/2008    normal   Social History:  reports that she has never smoked. She has never used smokeless tobacco. She reports that she drinks alcohol. She reports that she does not use illicit drugs.  Allergies  Allergen Reactions  . Morphine And Related Nausea Only and Other (See Comments)    "not well tolerated"  . Codeine Nausea Only and Other (See Comments)    "not well tolerated"  . Sulfa Antibiotics Itching    Family History  Problem Relation Age of Onset  . Heart disease Father     heart attack  . Hypertension Father   . Heart disease Mother     heart attack    Prior to Admission medications   Medication Sig Start Date End Date Taking? Authorizing Provider  amLODipine (NORVASC) 2.5 MG tablet Take 1 tablet (2.5 mg total) by mouth daily. 08/22/15  Yes Mihai Croitoru, MD  Ascorbic Acid (VITAMIN C) 1000 MG tablet Take 1,000 mg by mouth every evening.   Yes Historical Provider, MD  Calcium Carbonate-Vitamin D (CALCIUM + D PO) Take 1 tablet by mouth every evening.    Yes Historical Provider, MD  esomeprazole (NEXIUM) 20 MG capsule Take 20 mg by mouth daily at 12 noon.   Yes Historical  Provider, MD  Investigational - Study Medication Inject 1 application into the muscle every 30 (thirty) days. Additional Study [id number] drug trial for migraine headaches   Yes Historical Provider, MD  Multiple Vitamin (MULTIVITAMIN WITH MINERALS) TABS tablet Take 1 tablet by mouth daily.   Yes Historical Provider, MD  nebivolol (BYSTOLIC) 10 MG tablet TAKE 1 TABLET (10 MG TOTAL) BY MOUTH DAILY. Patient taking differently: Take 10 mg by mouth every morning. TAKE 1 TABLET (10 MG TOTAL) BY MOUTH DAILY. 02/24/15  Yes Mihai Croitoru, MD  PREMARIN 1.25 MG tablet Take 1.25 mg by mouth daily. 12/09/14  Yes Historical Provider, MD  Probiotic Product (PROBIOTIC FORMULA) CAPS Take 1 capsule by mouth every evening.   Yes Historical Provider, MD  SUMAtriptan (IMITREX) 100 MG tablet Take 1 tablet (100 mg total) by mouth as needed. Patient taking differently: Take 50 mg by mouth as needed.  10/28/14 02/18/16 Yes Paticia Stack, PA-C  Multiple Vitamin (MULTIVITAMIN PO) Take by mouth.      Historical Provider, MD  promethazine (PHENERGAN) 25 MG suppository Place 1 suppository (25 mg total) rectally every 6 (six) hours as needed for nausea. 11/05/14 02/26/16  Paticia Stack, PA-C   Physical Exam: Filed Vitals:   10/20/15 2325 10/21/15 0000 10/21/15 0137 10/21/15 0200  BP: 122/81 122/69  104/70  Pulse: 64 62  63  Temp: 98.1 F (36.7 C)  97.7 F (36.5 C)   TempSrc: Oral  Oral   Resp: 18 18  16   Height: 5\' 10"  (1.778 m)     Weight: 84.505 kg (186 lb 4.8 oz)     SpO2: 97% 97%  95%    Wt Readings from Last 3 Encounters:  10/20/15 84.505 kg (186 lb 4.8 oz)  08/22/15 83.122 kg (183 lb 4 oz)  02/24/15 86.002 kg (189 lb 9.6 oz)    General:  Appears calm and comfortable Eyes: PERRL, normal lids, irises & conjunctiva ENT: grossly normal hearing, lips & tongue Neck: no LAD, masses or thyromegaly Cardiovascular: RRR, no m/r/g. No LE edema. Telemetry: SR, no arrhythmias  Respiratory: CTA  bilaterally, no w/r/r. Normal respiratory effort. Abdomen: soft, ntnd Skin: no rash or induration seen on limited exam Musculoskeletal: grossly normal tone BUE/BLE Psychiatric: grossly normal mood and affect, speech fluent and appropriate Neurologic: Awake, alert, oriented 4,  cranial nerves and motor strength are grossly normal, sensation seems to be intact, nose to finger and Romberg tests were normal.           Labs on Admission:  Basic Metabolic Panel:  Recent Labs Lab 10/20/15 1555  NA 138  K 3.5  CL 104  CO2 24  GLUCOSE 120*  BUN 14  CREATININE 0.99  CALCIUM 8.9   Liver Function Tests:  Recent Labs Lab 10/20/15 1555  AST 26  ALT 17  ALKPHOS 65  BILITOT 0.5  PROT 6.3*  ALBUMIN 3.7   CBC:  Recent Labs Lab 10/20/15 1555  WBC 7.2  HGB 14.0  HCT 41.6  MCV 90.8  PLT 181    Radiological Exams on Admission: Ct Head Wo Contrast  10/20/2015  CLINICAL DATA:  60 year old female with history of migraines. Slurred speech onset 10 a.m. this morning. Numbness face and lips. Initial encounter. EXAM: CT HEAD WITHOUT CONTRAST TECHNIQUE: Contiguous axial images were obtained from the base of the skull through the vertex without intravenous contrast. COMPARISON:  None. FINDINGS: No intracranial hemorrhage or CT evidence of large acute infarct. Question of mild chronic small vessel disease changes. No intracranial mass lesion noted on this unenhanced exam. No hydrocephalus. Vascular calcifications. Visualized orbital structures unremarkable. Mastoid air cells, middle ear cavities and visualized paranasal sinuses are clear. IMPRESSION: No intracranial hemorrhage or CT evidence of large acute infarct. Question mild chronic small vessel disease changes. Electronically Signed   By: Genia Del M.D.   On: 10/20/2015 17:15   Mr Jeri Cos [id number] Contrast  10/20/2015  CLINICAL DATA:  60 year old female with intermittent slurred speech since this morning. No motor weakness. Initial encounter.  EXAM: MRI HEAD WITHOUT AND WITH CONTRAST TECHNIQUE: Multiplanar, multiecho pulse sequences of the brain and surrounding structures were obtained without and with intravenous contrast. CONTRAST:  75mL MULTIHANCE GADOBENATE DIMEGLUMINE 529 MG/ML IV SOLN COMPARISON:  Noncontrast head CT 1654 hours today. FINDINGS: There is a small cortically based infarct along the left peri-Rolandic cortex knee near the area of facial representation on series 4, image 38. There are 1 or 2 additional tiny punctate subcortical infarcts in the more superior left frontal gyrus (series 4, image 40 and series 6, image 14). Major intracranial vascular flow voids are preserved. No contralateral right hemisphere or posterior fossa restricted diffusion. There is mild T2 and FLAIR hyperintensity associated with the small cortical infarct (series 7, image 19) with no associated hemorrhage or mass effect. Elsewhere gray and white matter signal is normal for age. No chronic cerebral blood products or cortical encephalomalacia identified. No abnormal enhancement identified. No dural thickening. No midline shift, mass effect, evidence of mass lesion, ventriculomegaly, extra-axial collection or acute intracranial hemorrhage. Cervicomedullary junction and pituitary are within normal limits. Negative visualized cervical spine. Visualized bone marrow signal is within normal limits. Visible internal auditory structures appear normal. Mastoids are clear. Paranasal sinuses are clear. Negative orbit and scalp soft tissues. IMPRESSION: 1. Several small acute posterior left MCA territory cortical and white matter infarcts with no associated hemorrhage or mass effect. 2. Otherwise negative for age MRI appearance of the brain. Electronically Signed   By: Genevie Ann M.D.   On: 10/20/2015 21:25    EKG: Independently reviewed.  Vent. rate 61 BPM PR interval 156 ms QRS duration 94 ms QT/QTc 406/409 ms P-R-T axes 63 43 57 Sinus rhythm Nonspecific T wave  abnormality  Assessment/Plan Principal Problem:   Acute CVA (cerebrovascular accident) ([place]) Admit to telemetry/inpatient. Frequent neuro checks. Check hemoglobin A1c and fasting lipid panel. Check carotid Dopplers. Check echocardiogram. PT/OT evaluation.  Active Problems:   Migraine Asymptomatic at this time. Analgesics as needed. Imitrex as needed.    HTN (hypertension) Monitor blood pressure. Resume antihypertensive therapy once cleared by neuro.   Neurology is following.   Code Status: Full code. DVT Prophylaxis: Lovenox SQ. Family Communication: Her husband is present in the room. Disposition Plan: Admit for close monitoring and further evaluation.   Time spent: About 70 minutes were used in the process of his admission.   Reubin Milan, M.D. [facility] Pager [phone].

## 2015-10-21 NOTE — Progress Notes (Signed)
    [facility] has been requested to perform a transesophageal echocardiogram on [name] for stroke.  After careful review of history and examination, the risks and benefits of transesophageal echocardiogram have been explained including risks of esophageal damage, perforation (1:10,000 risk), bleeding, pharyngeal hematoma as well as other potential complications associated with conscious sedation including aspiration, arrhythmia, respiratory failure and death. Alternatives to treatment were discussed, questions were answered. Patient is willing to proceed.  TEE - Dr.[place] @ 1500 . NPO after midnight. Meds with sips.   Kyarra Vancamp, PA-C 10/21/2015 3:04 PM

## 2015-10-21 NOTE — Progress Notes (Signed)
  Echocardiogram 2D Echocardiogram has been performed. Bubble Study not performed because the patient has a known PFO.  Raven [name] M 10/21/2015, 10:06 AM

## 2015-10-21 NOTE — Evaluation (Addendum)
Occupational Therapy Evaluation Patient Details Name: MONZERRATH FRACASSI MRN: [id number] DOB: [birth year] Today's Date: 10/21/2015    History of Present Illness 60 y.o. female patient who was brought into the emergency room for further evaluation of speech problems. PMH includes migraine, cancer, urolithiasis, diverticulosis, seasonal allergies, HTN, and PFO. MRI revealed Several small acute posterior left MCA territory cortical and white matter infarcts.   Clinical Impression   Pt admitted with above. Education provided in session. Recommended pt get a full visual field assessment completed and to not drive until she gets this done.    Follow Up Recommendations  No OT follow up; full visual field assessment   Equipment Recommendations  None recommended by OT    Recommendations for Other Services       Precautions / Restrictions Restrictions Weight Bearing Restrictions: No      Mobility Bed Mobility Overal bed mobility: Independent                Transfers Overall transfer level: Independent                    Balance      Unsteady when simulating LB bathing while standing with single leg stance without UE support. Pt did well with same task with UE supported.                                        ADL Overall ADL's : Needs assistance/impaired               Lower Body Bathing Details (indicate cue type and reason): Mod I to stand and perform with UE support; Supervision without UE support     Lower Body Dressing: Independent;Sit to/from stand   Toilet Transfer: Independent;Ambulation (sit to stand from bed)           Functional mobility during ADLs:  (Independent for ambulation in room) General ADL Comments: Educated on safety such as sitting for LB dressing and have UE supported when washing LB while standing.  Pt not interested in UE exercises. Wrote down name of vision test for pt. Explained how to compensate for loss of  peripheral vision. Educated on BE FAST stroke education.     Vision Pt wears glasses all the time; reports no change from baseline Vision Assessment?: Yes Tracking/Visual Pursuits: Able to track stimulus in all quads without difficulty Visual Fields: Other (comment) (difficulty in Rt upper quadrant)   Perception     Praxis      Pertinent Vitals/Pain Pain Assessment: No/denies pain     Hand Dominance     Extremity/Trunk Assessment Upper Extremity Assessment Upper Extremity Assessment: Generalized weakness (bilateral shoulder flexors-weakness)   Lower Extremity Assessment Lower Extremity Assessment: Defer to PT evaluation       Communication Communication Communication: No difficulties   Cognition Arousal/Alertness: Awake/alert Behavior During Therapy: WFL for tasks assessed/performed Overall Cognitive Status: Within Functional Limits for tasks assessed                     General Comments       Exercises       Shoulder Instructions      Home Living Family/patient expects to be discharged to:: Private residence Living Arrangements: Spouse/significant other Available Help at Discharge: Family Type of Home: House             Bathroom Shower/Tub: Walk-in  shower   Bathroom Toilet: Standard            Lives With: Spouse    Prior Functioning/Environment Level of Independence: Independent             OT Diagnosis: Disturbance of vision   OT Problem List: Impaired vision/perception;Decreased strength;Impaired balance (sitting and/or standing)   OT Treatment/Interventions:      OT Goals(Current goals can be found in the care plan section)    OT Frequency:     Barriers to D/C:            Co-evaluation              End of Session    Activity Tolerance: Patient tolerated treatment well Patient left: in bed;with family/visitor present   Time: [id number] OT Time Calculation (min): 21 min Charges:  OT General Charges $OT  Visit: 1 Procedure OT Evaluation $OT Eval Moderate Complexity: 1 Procedure G-CodesBenito Mccreedy OTR/L [id number] 10/21/2015, 4:54 PM

## 2015-10-22 ENCOUNTER — Encounter (HOSPITAL_COMMUNITY)
Admission: EM | Disposition: A | Discharge status: Home / Self Care | Payer: Self-pay | Source: Home / Self Care | Attending: Internal Medicine

## 2015-10-22 ENCOUNTER — Inpatient Hospital Stay (HOSPITAL_COMMUNITY): Payer: Commercial Managed Care - PPO

## 2015-10-22 ENCOUNTER — Encounter (HOSPITAL_COMMUNITY): Payer: Self-pay | Admitting: *Deleted

## 2015-10-22 DIAGNOSIS — I82403 Acute embolism and thrombosis of unspecified deep veins of lower extremity, bilateral: Secondary | ICD-10-CM

## 2015-10-22 DIAGNOSIS — I639 Cerebral infarction, unspecified: Secondary | ICD-10-CM

## 2015-10-22 HISTORY — PX: TEE WITHOUT CARDIOVERSION: SHX5443

## 2015-10-22 HISTORY — PX: EP IMPLANTABLE DEVICE: SHX172B

## 2015-10-22 LAB — HEMOGLOBIN A1C
Hgb A1c MFr Bld: 5.1 % (ref 4.8–5.6)
Mean Plasma Glucose: 100 mg/dL

## 2015-10-22 SURGERY — ECHOCARDIOGRAM, TRANSESOPHAGEAL
Anesthesia: Moderate Sedation

## 2015-10-22 SURGERY — LOOP RECORDER INSERTION
Anesthesia: LOCAL

## 2015-10-22 MED ORDER — MIDAZOLAM HCL 5 MG/ML IJ SOLN
INTRAMUSCULAR | Status: AC
  Filled 2015-10-22: qty 2

## 2015-10-22 MED ORDER — FENTANYL CITRATE (PF) 100 MCG/2ML IJ SOLN
INTRAMUSCULAR | Status: AC
  Filled 2015-10-22: qty 2

## 2015-10-22 MED ORDER — ACETAMINOPHEN 650 MG RE SUPP
650.0000 mg | RECTAL | Status: DC | PRN
  Administered 2015-10-22: 650 mg via RECTAL
  Filled 2015-10-22: qty 1

## 2015-10-22 MED ORDER — BUTAMBEN-TETRACAINE-BENZOCAINE 2-2-14 % EX AERO
INHALATION_SPRAY | CUTANEOUS | Status: DC | PRN
  Administered 2015-10-22: 2 via TOPICAL

## 2015-10-22 MED ORDER — MIDAZOLAM HCL 10 MG/2ML IJ SOLN
INTRAMUSCULAR | Status: DC | PRN
Start: 2015-10-22 — End: 2015-10-22
  Administered 2015-10-22: 2 mg via INTRAVENOUS
  Administered 2015-10-22: 1 mg via INTRAVENOUS
  Administered 2015-10-22: 2 mg via INTRAVENOUS

## 2015-10-22 MED ORDER — ASPIRIN 325 MG PO TBEC: 325.0000 mg | DELAYED_RELEASE_TABLET | Freq: Every day | ORAL | Status: DC

## 2015-10-22 MED ORDER — SODIUM CHLORIDE 0.9 % IV SOLN
INTRAVENOUS | Status: DC
  Administered 2015-10-22: 500 mL via INTRAVENOUS

## 2015-10-22 MED ORDER — ATORVASTATIN CALCIUM 10 MG PO TABS: 10.0000 mg | ORAL_TABLET | Freq: Every day | ORAL | Status: DC

## 2015-10-22 MED ORDER — ACETAMINOPHEN 325 MG PO TABS: 650.0000 mg | ORAL_TABLET | Freq: Four times a day (QID) | ORAL | Status: DC | PRN

## 2015-10-22 MED ORDER — LIDOCAINE-EPINEPHRINE 1 %-1:100000 IJ SOLN
INTRAMUSCULAR | Status: DC | PRN
  Administered 2015-10-22: 30 mL via INTRADERMAL

## 2015-10-22 MED ORDER — LIDOCAINE-EPINEPHRINE 1 %-1:100000 IJ SOLN
INTRAMUSCULAR | Status: AC
  Filled 2015-10-22: qty 1

## 2015-10-22 MED ORDER — FENTANYL CITRATE (PF) 100 MCG/2ML IJ SOLN
INTRAMUSCULAR | Status: DC | PRN
  Administered 2015-10-22: 25 ug via INTRAVENOUS
  Administered 2015-10-22: 50 ug via INTRAVENOUS
  Administered 2015-10-22: 25 ug via INTRAVENOUS

## 2015-10-22 SURGICAL SUPPLY — 2 items
LOOP REVEAL LINQSYS (Prosthesis & Implant Heart) ×2 IMPLANT
PACK LOOP INSERTION (CUSTOM PROCEDURE TRAY) ×2 IMPLANT

## 2015-10-22 NOTE — CV Procedure (Signed)
Brief TEE note  LVEF >55% Trivial MR and PR No LA or LAA thrombus or mass +PFO with right to left shunting at rest.  For additional details see full report.  Kya Mayfield C. Oval Linsey, MD, [facility]  10/22/2015  3:53 PM

## 2015-10-22 NOTE — Progress Notes (Signed)
STROKE TEAM PROGRESS NOTE   SUBJECTIVE (INTERVAL HISTORY) Patient just back from doppler. Family at bedside. Tee today at 3p.   OBJECTIVE Temp:  [97.8 F (36.6 C)-98.2 F (36.8 C)] 97.8 F (36.6 C) (04/05 0704) Pulse Rate:  [60-70] 62 (04/05 0704) Cardiac Rhythm:  [-] Normal sinus rhythm (04/04 1920) Resp:  [16-20] 16 (04/05 0704) BP: (125-156)/(73-95) 125/79 mmHg (04/05 0704) SpO2:  [96 %-98 %] 96 % (04/05 0704)  CBC:   Recent Labs Lab 10/20/15 1555 10/21/15 0343  WBC 7.2 5.9  NEUTROABS  --  3.2  HGB 14.0 13.0  HCT 41.6 39.4  MCV 90.8 91.2  PLT 181 [id number]    Basic Metabolic Panel:   Recent Labs Lab 10/20/15 1555 10/21/15 0343  NA 138 138  K 3.5 3.4*  CL 104 105  CO2 24 24  GLUCOSE 120* 85  BUN 14 12  CREATININE 0.99 1.03*  CALCIUM 8.9 8.4*    Lipid Panel:     Component Value Date/Time   CHOL 170 10/21/2015 0343   TRIG 119 10/21/2015 0343   HDL 48 10/21/2015 0343   CHOLHDL 3.5 10/21/2015 0343   VLDL 24 10/21/2015 0343   LDLCALC 98 10/21/2015 0343   HgbA1c:  Lab Results  Component Value Date   HGBA1C 5.1 10/21/2015   Urine Drug Screen: No results found for: LABOPIA, COCAINSCRNUR, LABBENZ, AMPHETMU, THCU, LABBARB    IMAGING  Ct Head Wo Contrast 10/20/2015  No intracranial hemorrhage or CT evidence of large acute infarct. Question mild chronic small vessel disease changes.   CT HEAD  10/21/2015   MR detected left middle cerebral artery distribution small infarcts are not as well delineated on the present CT.  CTA NECK  10/21/2015   Minimal bulge medial aspect proximal descending thoracic aorta/aortic arch without focal ulceration. Plaque carotid bifurcation bilaterally extending into proximal right internal carotid arteries with less than 50% diameter narrowing bilaterally. No significant stenosis of the dominant right vertebral artery. Mild narrowing with moderate ectasia proximal left vertebral artery. Pleated appearance of the vertebral arteries at C2  level may be caused by dental artifact but cannot exclude fibromuscular dysplasia.   CTA HEAD  10/21/2015   Plaque cavernous segment internal carotid artery bilaterally with mild narrowing otherwise anterior circulation without medium or large size vessel significant stenosis or occlusion. Right vertebral artery is dominant. Vertebral arteries are ectatic as is the basilar artery without high-grade stenosis.   Mr Kizzie Fantasia Contrast 10/20/2015  1. Several small acute posterior left MCA territory cortical and white matter infarcts with no associated hemorrhage or mass effect. 2. Otherwise negative for age MRI appearance of the brain.   2D Echocardiogram  - Left ventricle: The cavity size was normal. There was mild focal basal hypertrophy of the septum. Systolic function was normal. The estimated ejection fraction was in the range of 55% to 60%. Wall motion was normal; there were no regional wall motion abnormalities. Doppler parameters are consistent with abnormal left ventricular relaxation (grade 1 diastolic dysfunction). - Aortic valve: There was trivial regurgitation. Impressions:   Normal LV systolic function; grade 1 diastolic dysfunction; trace AI and MR; mild TR.  Carotid Doppler   No evidence of a significant stenosis in bilateral carotid arteries. Vertebral arteries demonstrate antegrade flow.  Lower extremity Doppler's Bilateral: No evidence of DVT, superficial thrombosis, or Baker's Cyst.   PHYSICAL EXAM Pleasant middle-age Caucasian lady not in distress. . Afebrile. Head is nontraumatic. Neck is supple without bruit.    Cardiac  exam no murmur or gallop. Lungs are clear to auscultation. Distal pulses are well felt. Neurological Exam ;  Awake  Alert oriented x 3. Normal speech and language.eye movements full without nystagmus.fundi were not visualized. Vision acuity and fields appear normal. Hearing is normal. Palatal movements are normal. Face symmetric. Tongue midline. Normal  strength, tone, reflexes and coordination. Normal sensation. Gait deferred.   ASSESSMENT/PLAN Raven [name] is a 60 y.o. female with history of migraines, known PFO presenting with slurred speech with expressive aphasia. She did not receive IV t-PA due to nonfocal neurologic exam.   Stroke:  Left MCA territory cortical and subcortical infarcts embolic secondary to unknown source  MRI  left MCA territory cortical and subcortical infarcts   CT angiogram head no significant stenosis requiring intervention  CT angiogram neck R ICA < 50% stenosis, no significant stenosis requiring intervention  Carotid Doppler  pending   2D Echo  No source of embolus  LE venous dopplers negative  TEE scheduled for 3p today  implantable loop recorder if TEE neg  LDL 98  HgbA1c 5.1  Lovenox 40 mg sq daily for VTE prophylaxis Diet NPO time specified  No antithrombotic prior to admission, received 1 dose of aspirin 324 mg yesterday (4/3), now on No antithrombotic. Added aspirin 325 mg daily to start today  Patient counseled to be compliant with her antithrombotic medications  Ongoing aggressive stroke risk factor management  Therapy recommendations:  No PT, no OT  Disposition:  Anticipate return home  [facility] for discharge after TEE, loop if needed  followup Dr. Leonie Man in the office in 2 months. Order written  Hyperlipidemia  Home meds:  No statin  LDL 98, goal < 70  Added statin  Continue statin at discharge  Other Stroke Risk Factors  ETOH use  Migraines, enrolled in clinical trial where she receives injections. A monoclonal antibodies once a month  PFO  Menopausal on daily Premarin  Hospital day # [place] for Pager information 10/22/2015 9:23 AM  I have personally examined this patient, reviewed notes, independently viewed imaging studies, participated in medical decision making and plan of care. I have made any additions or  clarifications directly to the above note. Agree with note above.  Stroke team will sign off. Follow-up as an outpatient in stroke clinic in 2 months.  Antony Contras, MD Medical Director [facility] Pager: [phone] 10/22/2015 3:12 PM   To contact Stroke Continuity provider, please refer to [email or web address] After hours, contact General Neurology

## 2015-10-22 NOTE — Progress Notes (Signed)
VASCULAR LAB PRELIMINARY  PRELIMINARY  PRELIMINARY  PRELIMINARY     Bilateral lower extremity venous duplex completed.   Bilateral:  No evidence of DVT, superficial thrombosis, or Baker's Cyst.  Janifer Adie, RVT, RDMS 10/22/2015, 10:15 AM

## 2015-10-22 NOTE — Discharge Instructions (Signed)
Wound care instructions: Keep the area clean and dry for three days, do not get the wound/area wet for 3 days. Do not put any creams, salves, or ointments on the wound.

## 2015-10-22 NOTE — Progress Notes (Signed)
SLP Cancellation Note  Patient Details Name: CATHELEEN SPRINKLES MRN: [id number] DOB: [birth year]   Cancelled treatment:       Reason Eval/Treat Not Completed: Patient at procedure or test/unavailable. SLP team attempted to see pt, but off the floor, pt currently in procedure. Consider outpatient SLP eval if any concern for speech/language function.   Weldon Inches, [place], CCC-SLP     Vinetta Bergamo 10/22/2015, 4:32 PM

## 2015-10-22 NOTE — Progress Notes (Signed)
Patient is being d/c home. D/c instructions given and patient verbalized understanding. Condition stable 

## 2015-10-22 NOTE — Progress Notes (Signed)
  Echocardiogram Echocardiogram Transesophageal has been performed.  Raven [name] 10/22/2015, 4:04 PM

## 2015-10-22 NOTE — Progress Notes (Signed)
ELECTROPHYSIOLOGY CONSULT NOTE  Patient ID: Raven [name] MRN: [id number], DOB/AGE: May 09, 1956   Admit date: 10/20/2015 Date of Consult: 10/22/2015  Primary Physician: Thressa Sheller, MD Primary Cardiologist: Dr. Sallyanne Kuster Reason for Consultation: Cryptogenic stroke -  recommendations regarding Implantable Loop Recorder  History of Present Illness Raven [name] was admitted on 10/20/2015 with stroke.  They first developed speech difficulty while at home on the telephone.  Imaging demonstrated Left MCA territory cortical and subcortical infarcts embolic secondary to unknown source.    she has undergone workup for stroke including echocardiogram and carotid angio.  The patient has been monitored on telemetry which has demonstrated sinus rhythm with no arrhythmias.  Inpatient stroke work-up is to be completed with a TEE.   PMHx includes HTN and PFO, migraines in a clinical trial for migraine prophylactic therapy using monoclonal antibodies for the past 11 months. This is once a month injection of monoclonal antibodies. As well as breats and cervical cancer  Echocardiogram this admission demonstrated  Study Conclusions - Left ventricle: The cavity size was normal. There was mild focal  basal hypertrophy of the septum. Systolic function was normal.  The estimated ejection fraction was in the range of 55% to 60%.  Wall motion was normal; there were no regional wall motion  abnormalities. Doppler parameters are consistent with abnormal  left ventricular relaxation (grade 1 diastolic dysfunction). - Aortic valve: There was trivial regurgitation. Impressions: - Normal LV systolic function; grade 1 diastolic dysfunction; trace  AI and MR; mild TR.  03/21/08 Echo notes + saline bubble study concerning for small PFO or ASF, no doppler evidence of interatrial septal defect  10/22/15 VASCULAR LAB PRELIMINARY PRELIMINARY PRELIMINARY PRELIMINARY Bilateral lower extremity venous  duplex completed.  Bilateral: No evidence of DVT, superficial thrombosis, or Baker's Cyst.  Lab work is reviewed, VSS, afebrile  Prior to admission, the patient mentions chest pain that has been attributed to hiatal hernia, no shortness of breath, dizziness, very infrequent palpitations, no syncope.  They are recovering from their stroke with plans to home at discharge.  EP has been asked to evaluate for placement of an implantable loop recorder to monitor for atrial fibrillation.     Past Medical History  Diagnosis Date  . Migraine   . History of migraines   . Menopause   . Seasonal allergies   . Cancer (HCC)     cervical  . Abnormal Pap smear 08/2005  . Diverticulosis   . Kidney stones 2012  . PFO (patent foramen ovale)   . Hypertension      Surgical History:  Past Surgical History  Procedure Laterality Date  . Total abdominal hysterectomy      cervical cancer  . Partial mastectomy  2008  . Tubal ligation  1988  . Bunionectomy  1987  . US echocardiography  03/21/2008    Trace MR,TR & PR, positive saline contrast bubble study  . Nm myocar perf wall motion  02/28/2008    normal     Prescriptions prior to admission  Medication Sig Dispense Refill Last Dose  . amLODipine (NORVASC) 2.5 MG tablet Take 1 tablet (2.5 mg total) by mouth daily. 90 tablet 3 10/20/2015 at Unknown time  . Ascorbic Acid (VITAMIN C) 1000 MG tablet Take 1,000 mg by mouth every evening.   10/19/2015 at Unknown time  . Calcium Carbonate-Vitamin D (CALCIUM + D PO) Take 1 tablet by mouth every evening.    10/19/2015 at Unknown time  . esomeprazole (NEXIUM) 20 MG  capsule Take 20 mg by mouth daily at 12 noon.   10/20/2015 at Unknown time  . Investigational - Study Medication Inject 1 application into the muscle every 30 (thirty) days. Additional Study [id number] drug trial for migraine headaches   Past Month at Unknown time  . Multiple Vitamin (MULTIVITAMIN WITH MINERALS) TABS tablet Take 1 tablet by mouth  daily.   10/20/2015 at Unknown time  . nebivolol (BYSTOLIC) 10 MG tablet TAKE 1 TABLET (10 MG TOTAL) BY MOUTH DAILY. (Patient taking differently: Take 10 mg by mouth every morning. TAKE 1 TABLET (10 MG TOTAL) BY MOUTH DAILY.) 90 tablet 3 10/20/2015 at 0700  . PREMARIN 1.25 MG tablet Take 1.25 mg by mouth daily.  3 10/20/2015 at Unknown time  . Probiotic Product (PROBIOTIC FORMULA) CAPS Take 1 capsule by mouth every evening.   10/19/2015 at Unknown time  . SUMAtriptan (IMITREX) 100 MG tablet Take 1 tablet (100 mg total) by mouth as needed. (Patient taking differently: Take 50 mg by mouth as needed. ) 18 tablet 11 10/19/2015 at Unknown time  . Multiple Vitamin (MULTIVITAMIN PO) Take by mouth.     Taking  . promethazine (PHENERGAN) 25 MG suppository Place 1 suppository (25 mg total) rectally every 6 (six) hours as needed for nausea. 12 each 3 Taking    Inpatient Medications:  . amLODipine  2.5 mg Oral Daily  . aspirin EC  325 mg Oral Daily  . atorvastatin  10 mg Oral q1800  . enoxaparin (LOVENOX) injection  40 mg Subcutaneous Q24H  . multivitamin with minerals  1 tablet Oral Daily  . nebivolol  10 mg Oral q morning - 10a  . pantoprazole  40 mg Oral Daily  . sodium chloride flush  3 mL Intravenous Q12H    Allergies:  Allergies  Allergen Reactions  . Morphine And Related Nausea Only and Other (See Comments)    "not well tolerated"  . Codeine Nausea Only and Other (See Comments)    "not well tolerated"  . Sulfa Antibiotics Itching    Social History   Social History  . Marital Status: Married    Spouse Name: N/A  . Number of Children: N/A  . Years of Education: N/A   Occupational History  . Not on file.   Social History Main Topics  . Smoking status: Never Smoker   . Smokeless tobacco: Never Used  . Alcohol Use: Yes     Comment: rare / wine  . Drug Use: No  . Sexual Activity: Not on file   Other Topics Concern  . Not on file   Social History Narrative     Family History    Problem Relation Age of Onset  . Heart disease Father     heart attack  . Hypertension Father   . Heart disease Mother     heart attack      Review of Systems: All other systems reviewed and are otherwise negative except as noted above.  Physical Exam: Filed Vitals:   10/21/15 2118 10/22/15 0129 10/22/15 0704 10/22/15 0946  BP: 125/73 126/78 125/79 145/86  Pulse: 66 66 62 56  Temp: 98.2 F (36.8 C) 98.2 F (36.8 C) 97.8 F (36.6 C) 98.2 F (36.8 C)  TempSrc:  Oral Oral Oral  Resp: 18 16 16 16   Height:      Weight:      SpO2: 97% 98% 96% 99%    GEN- The patient is well appearing, alert and oriented x 3 today.  Head- normocephalic, atraumatic Eyes-  Sclera clear, conjunctiva pink Ears- hearing intact Oropharynx- clear Neck- supple Lungs- Clear to ausculation bilaterally, normal work of breathing Heart- Regular rate and rhythm, no murmurs, rubs or gallops  GI- soft, NT, ND Extremities- no clubbing, cyanosis, or edema MS- no significant deformity or atrophy Skin- no rash or lesion Psych- euthymic mood, full affect   Labs:   Lab Results  Component Value Date   WBC 5.9 10/21/2015   HGB 13.0 10/21/2015   HCT 39.4 10/21/2015   MCV 91.2 10/21/2015   PLT 164 10/21/2015    Recent Labs Lab 10/21/15 0343  NA 138  K 3.4*  CL 105  CO2 24  BUN 12  CREATININE 1.03*  CALCIUM 8.4*  PROT 5.6*  BILITOT 0.4  ALKPHOS 59  ALT 15  AST 17  GLUCOSE 85   No results found for: CKTOTAL, CKMB, CKMBINDEX, TROPONINI Lab Results  Component Value Date   CHOL 170 10/21/2015   Lab Results  Component Value Date   HDL 48 10/21/2015   Lab Results  Component Value Date   LDLCALC 98 10/21/2015   Lab Results  Component Value Date   TRIG 119 10/21/2015   Lab Results  Component Value Date   CHOLHDL 3.5 10/21/2015     Radiology/Studies:   Ct Head Wo Contrast 10/20/2015  CLINICAL DATA:  60 year old female with history of migraines. Slurred speech onset 10 a.m. this  morning. Numbness face and lips. Initial encounter. EXAM: CT HEAD WITHOUT CONTRAST TECHNIQUE: Contiguous axial images were obtained from the base of the skull through the vertex without intravenous contrast. COMPARISON:  None. FINDINGS: No intracranial hemorrhage or CT evidence of large acute infarct. Question of mild chronic small vessel disease changes. No intracranial mass lesion noted on this unenhanced exam. No hydrocephalus. Vascular calcifications. Visualized orbital structures unremarkable. Mastoid air cells, middle ear cavities and visualized paranasal sinuses are clear. IMPRESSION: No intracranial hemorrhage or CT evidence of large acute infarct. Question mild chronic small vessel disease changes. Electronically Signed   By: Genia Del M.D.   On: 10/20/2015 17:15   Ct Angio Neck W/cm &/or Wo/cm 10/21/2015  CLINICAL DATA:  60 year old hypertensive female with history of migraine headaches presenting with slurred speech and numbness in lips and face. Subsequent encounter. EXAM: CT ANGIOGRAPHY HEAD AND NECK TECHNIQUE: Multidetector CT imaging of the head and neck was performed using the standard protocol during bolus administration of intravenous contrast. Multiplanar CT image reconstructions and MIPs were obtained to evaluate the vascular anatomy. Carotid stenosis measurements (when applicable) are obtained utilizing NASCET criteria, using the distal internal carotid diameter as the denominator. CONTRAST:  50 cc Omnipaque 370. COMPARISON:  10/20/2015 brain MR. 10/20/2015 head CT. FINDINGS: CT HEAD Brain: MR detected left middle cerebral artery distribution small infarcts are not as well delineated on the present CT. No intracranial hemorrhage. No intracranial enhancing lesion. No hydrocephalus. Calvarium and skull base: Negative. Paranasal sinuses: Clear. Orbits: Negative. CTA NECK Aortic arch: 3 vessel arch with slight ectasia. Minimal bulge medial aspect proximal descending thoracic aorta/aortic arch  without focal ulceration. Right carotid system: Plaque right carotid bifurcation extending into proximal right internal carotid artery with less than 50% diameter narrowing. Left carotid system: Plaque left carotid bifurcation extending into proximal left internal carotid artery with less than 50% diameter narrowing. Vertebral arteries:Venous contrast causes artifact proximal right vertebral artery. No significant stenosis of the dominant right vertebral artery. Mild narrowing with moderate ectasia proximal left vertebral artery. Pleated appearance of  the vertebral arteries at C2 level may be caused by dental artifact but cannot exclude fibromuscular dysplasia. Skeleton: Mild cervical spondylotic changes C5-6. Other neck: No worrisome neck mass or adenopathy. Visualized lung apices are clear. CTA HEAD Anterior circulation: Plaque cavernous segment internal carotid artery bilaterally mild narrowing otherwise anterior circulation without medium or large size vessel significant stenosis or occlusion. Posterior circulation: Right vertebral artery is dominant. Vertebral arteries are ectatic as is the basilar artery without high-grade stenosis. Venous sinuses: Negative. Anatomic variants: Negative. Delayed phase: As above. IMPRESSION: CT HEAD MR detected left middle cerebral artery distribution small infarcts are not as well delineated on the present CT. CTA NECK Minimal bulge medial aspect proximal descending thoracic aorta/aortic arch without focal ulceration. Plaque carotid bifurcation bilaterally extending into proximal right internal carotid arteries with less than 50% diameter narrowing bilaterally. No significant stenosis of the dominant right vertebral artery. Mild narrowing with moderate ectasia proximal left vertebral artery. Pleated appearance of the vertebral arteries at C2 level may be caused by dental artifact but cannot exclude fibromuscular dysplasia. CTA HEAD Plaque cavernous segment internal carotid  artery bilaterally with mild narrowing otherwise anterior circulation without medium or large size vessel significant stenosis or occlusion. Right vertebral artery is dominant. Vertebral arteries are ectatic as is the basilar artery without high-grade stenosis. Electronically Signed   By: Genia Del M.D.   On: 10/21/2015 12:44   Mr Jeri Cos [id number] Contrast 10/20/2015  CLINICAL DATA:  60 year old female with intermittent slurred speech since this morning. No motor weakness. Initial encounter. EXAM: MRI HEAD WITHOUT AND WITH CONTRAST TECHNIQUE: Multiplanar, multiecho pulse sequences of the brain and surrounding structures were obtained without and with intravenous contrast. CONTRAST:  44mL MULTIHANCE GADOBENATE DIMEGLUMINE 529 MG/ML IV SOLN COMPARISON:  Noncontrast head CT 1654 hours today. FINDINGS: There is a small cortically based infarct along the left peri-Rolandic cortex knee near the area of facial representation on series 4, image 38. There are 1 or 2 additional tiny punctate subcortical infarcts in the more superior left frontal gyrus (series 4, image 40 and series 6, image 14). Major intracranial vascular flow voids are preserved. No contralateral right hemisphere or posterior fossa restricted diffusion. There is mild T2 and FLAIR hyperintensity associated with the small cortical infarct (series 7, image 19) with no associated hemorrhage or mass effect. Elsewhere gray and white matter signal is normal for age. No chronic cerebral blood products or cortical encephalomalacia identified. No abnormal enhancement identified. No dural thickening. No midline shift, mass effect, evidence of mass lesion, ventriculomegaly, extra-axial collection or acute intracranial hemorrhage. Cervicomedullary junction and pituitary are within normal limits. Negative visualized cervical spine. Visualized bone marrow signal is within normal limits. Visible internal auditory structures appear normal. Mastoids are clear. Paranasal  sinuses are clear. Negative orbit and scalp soft tissues. IMPRESSION: 1. Several small acute posterior left MCA territory cortical and white matter infarcts with no associated hemorrhage or mass effect. 2. Otherwise negative for age MRI appearance of the brain. Electronically Signed   By: Genevie Ann M.D.   On: 10/20/2015 21:25    12-lead ECG SR All prior EKG's in EPIC reviewed with no documented atrial fibrillation  Telemetry SR  Assessment and Plan:  1. Cryptogenic stroke The patient presents with cryptogenic stroke.  The patient has a TEE planned for this AM.  I spoke at length with the patient about monitoring for afib with either a 30 day event monitor or an implantable loop recorder.  Risks, benefits, and alteratives to  implantable loop recorder were discussed with the patient today.   At this time, the patient is very clear in their decision to proceed with implantable loop recorder.   Wound care was reviewed with the patient (keep incision clean and dry for 3 days).  Wound check Hortence Charter be scheduled.  Please call with questions.   Baldwin Jamaica, PA-C 10/22/2015  I have seen and examined this patient with Tommye Standard.  Agree with above, note added to reflect my findings.  On exam, regular rhythm, no murmurs, lungs clear.  Patient with cryptogenic stroke.  No acute cause found.  Discussed monitoring for atrial fibrillatoin.  Plan for LINQ placement.  Risks and benefits explained.  Risks include bleeding, infection.  Patient understands risks and has agreed.    Kristjan Derner M. Margy Sumler MD 10/22/2015 9:10 PM

## 2015-10-22 NOTE — Discharge Summary (Addendum)
Physician Discharge Summary  DIMPLE WILLIQUETTE [id number] DOB: 1955/08/01 DOA: 10/20/2015  PCP: Thressa Sheller, MD  Admit date: 10/20/2015 Discharge date: 12/11/2015  Time spent: 45 minutes  Recommendations for Outpatient Follow-up:  1. Dr.Sethi in 1 month, Loop recorder placed for cryptogenic CVA   Discharge Diagnoses:  Principal Problem:   Acute CVA (cerebrovascular accident) ([facility])   Suspected Embolic CVA   Migraine   HTN (hypertension)   Discharge Condition: stable  Diet recommendation: heart healthy  Filed Weights   10/20/15 2325  Weight: 84.505 kg (186 lb 4.8 oz)    History of present illness:  Chief Complaint: Slurred speech. HPI: Raven [name] is a 60 y.o. female with a past medical history hypertension, PFO, breast cancer, cervical cancer, migraine headaches, seasonal allergies, urolithiasis who comes to the emergency department due to slurred speech. Per patient, she was on the phone discussing something with a lawyer when she experienced sudden slurred speech and difficulty finding/articulating words. She is states that she finished that conversation very slowly, but had multiple recurrences during the day. She subsequently experienced tingling on her chin and left side of face in the afternoon  Hospital Course:  Ms. CLARECE SZEKERES is a 60 y.o. female with history of migraines, known PFO presenting with slurred speech with expressive aphasia.   Stroke: Left MCA territory cortical and subcortical infarcts embolic secondary to unknown source -MRI left MCA territory cortical and subcortical infarcts  -CT angiogram head no significant stenosis requiring intervention -CT angiogram neck R ICA < 50% stenosis, no significant stenosis requiring intervention -2D Echo EF 55%   -TEE: No LA or LAA thrombus or mass, +PFO with right to left shunting at rest, LE venous dopplers negative for DVT   -Loop recorder placed for cryptogenic stroke -  LDL 98, HgbA1c  5.1 - Neurology consulted, No antithrombotic prior to admission, now on aspirin 325 mg daily - Pt/OT/SLP eval completed: No PT needed - FU with Dr.Sethi  Hyperlipidemia -LDL 98, goal < 70, Added statin  Migraine -on clinical trial, getting monoclonal Ab injections   Procedures:  TEE  Loop Recorder  Consultations:  Neurology  EP for Loop Recorder  Discharge Exam: Filed Vitals:   10/22/15 1605 10/22/15 1615  BP: 137/82 140/85  Pulse: 65 63  Temp:    Resp: 15 21    General: AAOx3 Cardiovascular: S1S2/RRR Respiratory: CTAB  Discharge Instructions   Discharge Instructions    Ambulatory referral to Neurology    Complete by:  As directed   Please schedule post stroke follow up in 2 months.     Diet - low sodium heart healthy    Complete by:  As directed      Increase activity slowly    Complete by:  As directed           Discharge Medication List as of 10/22/2015  5:57 PM    START taking these medications   Details  aspirin EC 325 MG EC tablet Take 1 tablet (325 mg total) by mouth daily., Starting 10/22/2015, Until Discontinued, OTC    atorvastatin (LIPITOR) 10 MG tablet Take 1 tablet (10 mg total) by mouth daily at 6 PM., Starting 10/22/2015, Until Discontinued, Print      CONTINUE these medications which have NOT CHANGED   Details  amLODipine (NORVASC) 2.5 MG tablet Take 1 tablet (2.5 mg total) by mouth daily., Starting 08/22/2015, Until Discontinued, Normal    Ascorbic Acid (VITAMIN C) 1000 MG tablet Take 1,000 mg by mouth every  evening., Until Discontinued, Historical Med    Calcium Carbonate-Vitamin D (CALCIUM + D PO) Take 1 tablet by mouth every evening. , Until Discontinued, Historical Med    esomeprazole (NEXIUM) 20 MG capsule Take 20 mg by mouth daily at 12 noon., Until Discontinued, Historical Med    Investigational - Study Medication Inject 1 application into the muscle every 30 (thirty) days. Additional Study [id number] drug trial for migraine  headaches, Until Discontinued, Historical Med    Multiple Vitamin (MULTIVITAMIN WITH MINERALS) TABS tablet Take 1 tablet by mouth daily., Until Discontinued, Historical Med    nebivolol (BYSTOLIC) 10 MG tablet TAKE 1 TABLET (10 MG TOTAL) BY MOUTH DAILY., Normal    PREMARIN 1.25 MG tablet Take 1.25 mg by mouth daily., Starting 12/09/2014, Until Discontinued, Historical Med    Probiotic Product (PROBIOTIC FORMULA) CAPS Take 1 capsule by mouth every evening., Until Discontinued, Historical Med    SUMAtriptan (IMITREX) 100 MG tablet Take 1 tablet (100 mg total) by mouth as needed., Starting 10/28/2014, Until Wed 02/18/16, Normal    Multiple Vitamin (MULTIVITAMIN PO) Take by mouth.  , Until Discontinued, Historical Med    promethazine (PHENERGAN) 25 MG suppository Place 1 suppository (25 mg total) rectally every 6 (six) hours as needed for nausea., Starting 11/05/2014, Until Thu 02/26/16, Normal       Allergies  Allergen Reactions  . Morphine And Related Nausea Only and Other (See Comments)    "not well tolerated"  . Codeine Nausea Only and Other (See Comments)    "not well tolerated"  . Sulfa Antibiotics Itching   Follow-up Information    Follow up with SETHI,PRAMOD, MD In 2 months.   Specialties:  Neurology, Radiology   Why:  Stroke Clinic, Office will call you with appointment date & time   Contact information:   [place] [address] [phone]       Follow up with [facility] On 11/03/2015.   Specialty:  Cardiology   Why:  10:30AM, wound check   Contact information:   [address] [place] [phone]       The results of significant diagnostics from this hospitalization (including imaging, microbiology, ancillary and laboratory) are listed below for reference.    Significant Diagnostic Studies: No results found.  Microbiology: No results found for this or any previous visit (from  the past 240 hour(s)).   Labs: Basic Metabolic Panel: No results for input(s): NA, K, CL, CO2, GLUCOSE, BUN, CREATININE, CALCIUM, MG, PHOS in the last 168 hours. Liver Function Tests: No results for input(s): AST, ALT, ALKPHOS, BILITOT, PROT, ALBUMIN in the last 168 hours. No results for input(s): LIPASE, AMYLASE in the last 168 hours. No results for input(s): AMMONIA in the last 168 hours. CBC: No results for input(s): WBC, NEUTROABS, HGB, HCT, MCV, PLT in the last 168 hours. Cardiac Enzymes: No results for input(s): CKTOTAL, CKMB, CKMBINDEX, TROPONINI in the last 168 hours. BNP: BNP (last 3 results) No results for input(s): BNP in the last 8760 hours.  ProBNP (last 3 results) No results for input(s): PROBNP in the last 8760 hours.  CBG: No results for input(s): GLUCAP in the last 168 hours.     SignedDomenic Polite MD.  [facility] 12/11/2015, 11:06 AM

## 2015-10-23 ENCOUNTER — Encounter (HOSPITAL_COMMUNITY): Payer: Self-pay | Admitting: Cardiology

## 2015-11-03 ENCOUNTER — Encounter: Payer: Self-pay | Admitting: Cardiology

## 2015-11-03 ENCOUNTER — Ambulatory Visit (INDEPENDENT_AMBULATORY_CARE_PROVIDER_SITE_OTHER): Payer: Commercial Managed Care - PPO | Admitting: *Deleted

## 2015-11-03 DIAGNOSIS — I639 Cerebral infarction, unspecified: Secondary | ICD-10-CM

## 2015-11-03 LAB — CUP PACEART INCLINIC DEVICE CHECK: MDC IDC SESS DTM: 20170417113048

## 2015-11-03 NOTE — Progress Notes (Signed)
ILR wound check appointment. Steri-strips previously removed by patient. Wound without redness or edema. Incision edges approximated, wound well healed.   Battery status: good. R-waves 0.27mV. No symptom, tachy, pause, brady, or AF episodes. Monthly summary reports and ROV with WC PRN.

## 2015-11-05 ENCOUNTER — Ambulatory Visit: Payer: PRIVATE HEALTH INSURANCE

## 2015-11-14 ENCOUNTER — Other Ambulatory Visit: Payer: Self-pay

## 2015-11-14 ENCOUNTER — Other Ambulatory Visit: Payer: Self-pay | Admitting: Neurology

## 2015-11-14 MED ORDER — ASPIRIN 325 MG PO TBEC: 325.0000 mg | DELAYED_RELEASE_TABLET | Freq: Every day | ORAL | Status: AC

## 2015-11-14 MED ORDER — ATORVASTATIN CALCIUM 10 MG PO TABS: 10.0000 mg | ORAL_TABLET | Freq: Every day | ORAL | Status: DC

## 2015-11-14 MED ORDER — ASPIRIN 325 MG PO TBEC: 325.0000 mg | DELAYED_RELEASE_TABLET | Freq: Every day | ORAL | Status: DC

## 2015-11-14 NOTE — Telephone Encounter (Signed)
Patient called regarding aspirin EC 325 MG EC tablet and atorvastatin (LIPITOR) 10 MG tablet, states Dr. Leonie Man wanted her to continue this medication however only has prescriptions for one month and two month follow up is scheduled for 12/24/15.

## 2015-11-14 NOTE — Telephone Encounter (Signed)
Rn call patient that she was given another refill until her appt in June 2017. Pt will be a new patient for hospital follow up.

## 2015-11-21 ENCOUNTER — Ambulatory Visit (INDEPENDENT_AMBULATORY_CARE_PROVIDER_SITE_OTHER): Payer: Commercial Managed Care - PPO | Admitting: *Deleted

## 2015-11-21 DIAGNOSIS — I639 Cerebral infarction, unspecified: Secondary | ICD-10-CM | POA: Diagnosis not present

## 2015-11-26 NOTE — Progress Notes (Signed)
[facility] Summary Report / Loop Recorder 

## 2015-11-30 ENCOUNTER — Other Ambulatory Visit: Payer: Self-pay | Admitting: Physician Assistant

## 2015-11-30 DIAGNOSIS — G43909 Migraine, unspecified, not intractable, without status migrainosus: Secondary | ICD-10-CM

## 2015-12-01 NOTE — Telephone Encounter (Signed)
-----   Message from Francia Greaves sent at 12/01/2015  8:15 AM EDT ----- Regarding: Refill Request Contact: [phone] Would like a refill on her imitrex [facility]ses [facility] [address] in [place] her make an appt for 6/9, her last visit was in 10/2014

## 2015-12-01 NOTE — Telephone Encounter (Signed)
Sent Rx to [facility]

## 2015-12-22 ENCOUNTER — Ambulatory Visit (INDEPENDENT_AMBULATORY_CARE_PROVIDER_SITE_OTHER): Payer: Commercial Managed Care - PPO | Admitting: *Deleted

## 2015-12-22 DIAGNOSIS — I639 Cerebral infarction, unspecified: Secondary | ICD-10-CM | POA: Diagnosis not present

## 2015-12-22 NOTE — Progress Notes (Signed)
[facility] Summary Report / Loop Recorder 

## 2015-12-24 ENCOUNTER — Encounter: Payer: Self-pay | Admitting: Neurology

## 2015-12-24 ENCOUNTER — Ambulatory Visit (INDEPENDENT_AMBULATORY_CARE_PROVIDER_SITE_OTHER): Payer: Commercial Managed Care - PPO | Admitting: Neurology

## 2015-12-24 VITALS — BP 132/83 | HR 58 | Ht 69.5 in | Wt 185.6 lb

## 2015-12-24 DIAGNOSIS — I63132 Cerebral infarction due to embolism of left carotid artery: Secondary | ICD-10-CM

## 2015-12-24 NOTE — Progress Notes (Signed)
[facility] [address] [place]. [place] [address] [phone]       OFFICE FOLLOW-UP NOTE  [name] Date of Birth:  17-Apr-1956 Medical Record Number:  [id number]   HPI: Ms Raven [name] is a 47 year Caucasian lady seen today for first office follow-up visit following hospital admission for stroke in April 2017.Raven [name] is an 60 y.o. female patient who was brought into the emergency room on 10/20/2015 for further evaluation of speech problems she started having that morning. They've been occurring intermittently where she would slur and could not get certain words out properly. This type of slurring of the speech lasted couple of minutes at a time and happen multiple times throughout this morning. She did not have any trouble understanding her husband. Intermittent in between the episodes of slurred speech, she had fluent speech and able to speak full sentences without problem. Denies any other neurological symptoms in addition to this, no numbness in her upper or lower extremities or on the face. She did have some subjective paresthesias in the chin bilaterally but when she touches the fingers she did not have any numbness. No vision problems no motor weakness or gait difficulty or no vertigo.She never had any strokes before.She has very frequent migraines occurring 3-4 times a week and she has been in a clinical trial for migraine prophylactic therapy using monoclonal antibodies for the past 11 months. This is once a month injection of monoclonal antibodies. She has not noticed a significant improvement in her migraine frequency while she was on the trial. Otherwise she does not take any other prophylactic medication for migraine. She is on a couple of blood pressure pills. She takes Imitrex as needed. She hasn't taken Imitrex Imitrex this morning. She denies any headache or migraine symptoms this morning prior to onset of her sleep symptoms. She denies any  headache at this time.  Date last known well: 2017, April 3rd Time last known well: At 9 AM tPA Given: No: Nonfocal neurological exam. MRI scan of the brain showed a small punctate left frontal cortical infarct. MRA of the brain and carotid ultrasound showed no significant large vessel stenosis or occlusion. MRA of the brain showed no large vessel stenosis or occlusion. Carotid ultrasound was unremarkable. Transthoracic echo showed normal ejection fraction. Patient hadn't known history of PFO but lower extremity venous Dopplers were negative for DVT. LDL cholesterol was 90 mg percent and hemoglobin A1c was 5.1. She has started on aspirin and Lipitor and states she is doing well. She has had no bleeding or bruising. She is tolerating Lipitor without muscle aches and pains. Blood pressure is well controlled and today it is 132/83. She had TEE which showed no obvious cardiac source of embolism except PFO. Loop recorder has been inserted and so for paroxysmal atrial fibrillation have not yet been found. Patient admits to significant stress in her job and reports almost daily flushing of the cheeks as well as sweating over the shoulders which may be perhaps stress related. She has finished part sparing in a migraine recently but is yet in the follow-up phase. She has bought a elliptical and started exercising but has not lost any weight. She has been full neurological recovery and has no residual symptoms of deficits.  ROS:   14 system review of systems is positive for  flushing, anxiety, headache, weight gain and all other systems negative PMH:  Past Medical History  Diagnosis Date  . Migraine   . History  of migraines   . Menopause   . Seasonal allergies   . Cancer (HCC)     cervical  . Abnormal Pap smear 08/2005  . Diverticulosis   . Kidney stones 2012  . PFO (patent foramen ovale)   . Hypertension   . Stroke [facility])     Social History:  Social History   Social History  . Marital Status:  Married    Spouse Name: N/A  . Number of Children: N/A  . Years of Education: N/A   Occupational History  . Not on file.   Social History Main Topics  . Smoking status: Never Smoker   . Smokeless tobacco: Never Used  . Alcohol Use: 0.6 oz/week    1 Glasses of wine per week     Comment: rare / wine  . Drug Use: No  . Sexual Activity: Not on file   Other Topics Concern  . Not on file   Social History Narrative    Medications:   Current Outpatient Prescriptions on File Prior to Visit  Medication Sig Dispense Refill  . amLODipine (NORVASC) 2.5 MG tablet Take 1 tablet (2.5 mg total) by mouth daily. 90 tablet 3  . Ascorbic Acid (VITAMIN C) 1000 MG tablet Take 1,000 mg by mouth every evening.    Marland Kitchen aspirin 325 MG EC tablet Take 1 tablet (325 mg total) by mouth daily. 30 tablet 1  . atorvastatin (LIPITOR) 10 MG tablet Take 1 tablet (10 mg total) by mouth daily at 6 PM. 30 tablet 1  . Calcium Carbonate-Vitamin D (CALCIUM + D PO) Take 1 tablet by mouth every evening.     Marland Kitchen esomeprazole (NEXIUM) 20 MG capsule Take 20 mg by mouth daily at 12 noon.    . Multiple Vitamin (MULTIVITAMIN PO) Take by mouth.      . nebivolol (BYSTOLIC) 10 MG tablet TAKE 1 TABLET (10 MG TOTAL) BY MOUTH DAILY. (Patient taking differently: Take 10 mg by mouth every morning. TAKE 1 TABLET (10 MG TOTAL) BY MOUTH DAILY.) 90 tablet 3  . PREMARIN 1.25 MG tablet Take 1.25 mg by mouth daily.  3  . Probiotic Product (PROBIOTIC FORMULA) CAPS Take 1 capsule by mouth every evening.    . promethazine (PHENERGAN) 25 MG suppository Place 1 suppository (25 mg total) rectally every 6 (six) hours as needed for nausea. 12 each 3  . SUMAtriptan (IMITREX) 100 MG tablet TAKE 1 TABLET (100 MG TOTAL) BY MOUTH AS NEEDED. 18 tablet 11   No current facility-administered medications on file prior to visit.    Allergies:   Allergies  Allergen Reactions  . Morphine And Related Nausea Only and Other (See Comments)    "not well tolerated"    . Codeine Nausea Only and Other (See Comments)    "not well tolerated"  . Sulfa Antibiotics Itching    Physical Exam General: well developed, well nourished Middle-age Caucasian lady, seated, in no evident distress Head: head normocephalic and atraumatic.  Neck: supple with no carotid or supraclavicular bruits Cardiovascular: regular rate and rhythm, no murmurs Musculoskeletal: no deformity Skin:  no rash/petichiae Vascular:  Normal pulses all extremities Filed Vitals:   12/24/15 0858  BP: 132/83  Pulse: 58   Neurologic Exam Mental Status: Awake and fully alert. Oriented to place and time. Recent and remote memory intact. Attention span, concentration and fund of knowledge appropriate. Mood and affect appropriate.  Cranial Nerves: Fundoscopic exam reveals sharp disc margins. Pupils equal, briskly reactive to light. Extraocular movements full  without nystagmus. Visual fields full to confrontation. Hearing intact. Facial sensation intact. Face, tongue, palate moves normally and symmetrically.  Motor: Normal bulk and tone. Normal strength in all tested extremity muscles. Sensory.: intact to touch ,pinprick .position and vibratory sensation.  Coordination: Rapid alternating movements normal in all extremities. Finger-to-nose and heel-to-shin performed accurately bilaterally. Gait and Station: Arises from chair without difficulty. Stance is normal. Gait demonstrates normal stride length and balance . Able to heel, toe and tandem walk without difficulty.  Reflexes: 1+ and symmetric. Toes downgoing.   NIHSS  0 Modified Rankin  0  ASSESSMENT: 50 year Caucasian lady with embolic left frontal MCA branch infarct in April 2017 of cryptogenic etiology. Vascular risk factors of hyperlipidemia , PFO and hypertension.    PLAN: I had a long d/w patient about her recent stroke, risk for recurrent stroke/TIAs, personally independently reviewed imaging studies and stroke evaluation results and  answered questions.Continue aspirin 325 mg daily  for secondary stroke prevention and maintain strict control of hypertension with blood pressure goal below 130/90, diabetes with hemoglobin A1c goal below 6.5% and lipids with LDL cholesterol goal below 70 mg/dL. I also advised the patient to eat a healthy diet with plenty of whole grains, cereals, fruits and vegetables, exercise regularly and maintain ideal body weight. I also advised the patient to participate in activities like meditation, yoga and regular exercise to help with stress laxation to deal with her anxiety. If she needed further help I advised her to see her primary physician for that. She was also given information to review about possible participation in the [place] trial if interested. I also had a long discussion with her about PFO and stroke risk and available treatment options including endovascular PFO closure. I recommend medical therapy for now and consider closure only if she has recurrent symptoms Followup in the future with me in  6 months or call earlier if necessary.Greater than 50% of time during this 30 minute visit was spent on counseling,explanation of diagnosis, planning of further management, discussion with patient and family and coordination of care Antony Contras, MD Medical Director [place] Pager: [phone] 12/24/2015 10:04 AM  Note: This document was prepared with digital dictation and possible smart phrase technology. Any transcriptional errors that result from this process are unintentional

## 2015-12-24 NOTE — Patient Instructions (Signed)
I had a long d/w patient about her recent stroke, risk for recurrent stroke/TIAs, personally independently reviewed imaging studies and stroke evaluation results and answered questions.Continue aspirin 325 mg daily  for secondary stroke prevention and maintain strict control of hypertension with blood pressure goal below 130/90, diabetes with hemoglobin A1c goal below 6.5% and lipids with LDL cholesterol goal below 70 mg/dL. I also advised the patient to eat a healthy diet with plenty of whole grains, cereals, fruits and vegetables, exercise regularly and maintain ideal body weight. I also advised the patient to participate in activities like meditation, yoga and regular exercise to help with stress laxation to deal with her anxiety. If she needed further help I advised her to see her primary physician for that. She was also given information to review about possible participation in the [place] trial if interested. Followup in the future with me in  6 months or call earlier if necessary. Stroke Prevention Some medical conditions and behaviors are associated with an increased chance of having a stroke. You may prevent a stroke by making healthy choices and managing medical conditions. HOW CAN I REDUCE MY RISK OF HAVING A STROKE?   Stay physically active. Get at least 30 minutes of activity on most or all days.  Do not smoke. It may also be helpful to avoid exposure to secondhand smoke.  Limit alcohol use. Moderate alcohol use is considered to be:  No more than 2 drinks per day for men.  No more than 1 drink per day for nonpregnant women.  Eat healthy foods. This involves:  Eating 5 or more servings of fruits and vegetables a day.  Making dietary changes that address high blood pressure (hypertension), high cholesterol, diabetes, or obesity.  Manage your cholesterol levels.  Making food choices that are high in fiber and low in saturated fat, trans fat, and cholesterol may control  cholesterol levels.  Take any prescribed medicines to control cholesterol as directed by your health care provider.  Manage your diabetes.  Controlling your carbohydrate and sugar intake is recommended to manage diabetes.  Take any prescribed medicines to control diabetes as directed by your health care provider.  Control your hypertension.  Making food choices that are low in salt (sodium), saturated fat, trans fat, and cholesterol is recommended to manage hypertension.  Ask your health care provider if you need treatment to lower your blood pressure. Take any prescribed medicines to control hypertension as directed by your health care provider.  If you are 78-30 years of age, have your blood pressure checked every 3-5 years. If you are 59 years of age or older, have your blood pressure checked every year.  Maintain a healthy weight.  Reducing calorie intake and making food choices that are low in sodium, saturated fat, trans fat, and cholesterol are recommended to manage weight.  Stop drug abuse.  Avoid taking birth control pills.  Talk to your health care provider about the risks of taking birth control pills if you are over 66 years old, smoke, get migraines, or have ever had a blood clot.  Get evaluated for sleep disorders (sleep apnea).  Talk to your health care provider about getting a sleep evaluation if you snore a lot or have excessive sleepiness.  Take medicines only as directed by your health care provider.  For some people, aspirin or blood thinners (anticoagulants) are helpful in reducing the risk of forming abnormal blood clots that can lead to stroke. If you have the irregular heart  rhythm of atrial fibrillation, you should be on a blood thinner unless there is a good reason you cannot take them.  Understand all your medicine instructions.  Make sure that other conditions (such as anemia or atherosclerosis) are addressed. SEEK IMMEDIATE MEDICAL CARE IF:   You  have sudden weakness or numbness of the face, arm, or leg, especially on one side of the body.  Your face or eyelid droops to one side.  You have sudden confusion.  You have trouble speaking (aphasia) or understanding.  You have sudden trouble seeing in one or both eyes.  You have sudden trouble walking.  You have dizziness.  You have a loss of balance or coordination.  You have a sudden, severe headache with no known cause.  You have new chest pain or an irregular heartbeat. Any of these symptoms may represent a serious problem that is an emergency. Do not wait to see if the symptoms will go away. Get medical help at once. Call your local emergency services (911 in [place] Do not drive yourself to the hospital.   This information is not intended to replace advice given to you by your health care provider. Make sure you discuss any questions you have with your health care provider.   Document Released: 08/12/2004 Document Revised: 07/26/2014 Document Reviewed: 01/05/2013 Elsevier Interactive Patient Education [employer].

## 2015-12-26 ENCOUNTER — Ambulatory Visit (INDEPENDENT_AMBULATORY_CARE_PROVIDER_SITE_OTHER): Payer: Commercial Managed Care - PPO | Admitting: Physician Assistant

## 2015-12-26 ENCOUNTER — Encounter: Payer: Self-pay | Admitting: Physician Assistant

## 2015-12-26 VITALS — BP 142/88 | HR 54 | Resp 16 | Ht 69.5 in | Wt 186.0 lb

## 2015-12-26 DIAGNOSIS — G43009 Migraine without aura, not intractable, without status migrainosus: Secondary | ICD-10-CM | POA: Diagnosis not present

## 2015-12-26 DIAGNOSIS — G43909 Migraine, unspecified, not intractable, without status migrainosus: Secondary | ICD-10-CM

## 2015-12-26 DIAGNOSIS — I639 Cerebral infarction, unspecified: Secondary | ICD-10-CM

## 2015-12-26 DIAGNOSIS — Z566 Other physical and mental strain related to work: Secondary | ICD-10-CM | POA: Diagnosis not present

## 2015-12-26 MED ORDER — SUMATRIPTAN SUCCINATE 100 MG PO TABS: ORAL_TABLET | ORAL | Status: DC

## 2015-12-26 MED ORDER — ALPRAZOLAM 0.5 MG PO TABS: 0.5000 mg | ORAL_TABLET | Freq: Every evening | ORAL | Status: DC | PRN

## 2015-12-26 MED ORDER — PROMETHAZINE HCL 25 MG RE SUPP: 25.0000 mg | Freq: Four times a day (QID) | RECTAL | Status: DC | PRN

## 2015-12-30 DIAGNOSIS — Z566 Other physical and mental strain related to work: Secondary | ICD-10-CM | POA: Insufficient documentation

## 2015-12-30 NOTE — Patient Instructions (Signed)

## 2015-12-30 NOTE — Progress Notes (Signed)
Patient ID: Raven [name], female   DOB: 01-20-1956, 60 y.o.   MRN: [id number] History:  Raven [name] is a 60 y.o. [phone] who presents to clinic today for follow up of migraine headache in light of recent CVA.  She was involved in year long CGRP study for migraines in which she was in minority of patients not receiving benefit from the medication.  Nonetheless, she persisted in the trial for the duration in an effort to promote the science of migraine.  She did not notice side effects from medication but did not respond well in terms of headache reduction in frequency or severity.  She was entering last month of the study when her CVA occurred.  She notes it was a particularly stressful day at work with multiple major stressful events all coinciding.  She was on the phone with an attorney when she could not get her words out.  After some time, she recovered somewhat and assumed it to be a medication side effect to a new BP med.  Her husband brought her med to work and was able to witness another episode of the word slurring/loss.  She was convinced by another party to go to the hospital after several hours of this intermittent symptom.  At the hospital CT was normal but MRI confirmed new CVA.  Neurology has been working with her since that time.  She is not having uncontrolled HTN.  She has a LOOP monitor to watch for possible AFIB although this has yet been detected.   None detected yet.  HTN controlled.  Dr. Leonie Man reportedly has discussed use of Imitrex with her and has no issue.  She is having frequent migraine.  She is not able to retire or change jobs in the foreseeable future.     Past Medical History  Diagnosis Date  . Migraine   . History of migraines   . Menopause   . Seasonal allergies   . Cancer (HCC)     cervical  . Abnormal Pap smear 08/2005  . Diverticulosis   . Kidney stones 2012  . PFO (patent foramen ovale)   . Hypertension   . Stroke [facility])     Social History    Social History  . Marital Status: Married    Spouse Name: N/A  . Number of Children: N/A  . Years of Education: N/A   Occupational History  . Not on file.   Social History Main Topics  . Smoking status: Never Smoker   . Smokeless tobacco: Never Used  . Alcohol Use: 0.6 oz/week    1 Glasses of wine per week     Comment: rare / wine  . Drug Use: No  . Sexual Activity: Not on file   Other Topics Concern  . Not on file   Social History Narrative    Family History  Problem Relation Age of Onset  . Heart disease Father     heart attack  . Hypertension Father   . Heart disease Mother     heart attack    Allergies  Allergen Reactions  . Morphine And Related Nausea Only and Other (See Comments)    "not well tolerated"  . Codeine Nausea Only and Other (See Comments)    "not well tolerated"  . Sulfa Antibiotics Itching    Current Outpatient Prescriptions on File Prior to Visit  Medication Sig Dispense Refill  . amLODipine (NORVASC) 2.5 MG tablet Take 1 tablet (2.5 mg total) by mouth daily. [place]  tablet 3  . Ascorbic Acid (VITAMIN C) 1000 MG tablet Take 1,000 mg by mouth every evening.    Marland Kitchen aspirin 325 MG EC tablet Take 1 tablet (325 mg total) by mouth daily. 30 tablet 1  . atorvastatin (LIPITOR) 10 MG tablet Take 1 tablet (10 mg total) by mouth daily at 6 PM. 30 tablet 1  . Calcium Carbonate-Vitamin D (CALCIUM + D PO) Take 1 tablet by mouth every evening.     Marland Kitchen esomeprazole (NEXIUM) 20 MG capsule Take 20 mg by mouth daily at 12 noon.    . Multiple Vitamin (MULTIVITAMIN PO) Take by mouth.      . nebivolol (BYSTOLIC) 10 MG tablet TAKE 1 TABLET (10 MG TOTAL) BY MOUTH DAILY. (Patient taking differently: Take 10 mg by mouth every morning. TAKE 1 TABLET (10 MG TOTAL) BY MOUTH DAILY.) 90 tablet 3  . PREMARIN 1.25 MG tablet Take 1.25 mg by mouth daily.  3  . Probiotic Product (PROBIOTIC FORMULA) CAPS Take 1 capsule by mouth every evening.     No current facility-administered  medications on file prior to visit.     Review of Systems:  All pertinent positive/negative included in HPI, all other review of systems are negative  Objective:  Physical Exam BP 142/88 mmHg  Pulse 54  Resp 16  Ht 5' 9.5" (1.765 m)  Wt 186 lb (84.369 kg)  BMI 27.08 kg/m2 CONSTITUTIONAL: Well-developed, well-nourished female in no acute distress.  EYES: EOM intact ENT: Normocephalic CARDIOVASCULAR: Regular rate and rhythm with no adventitious sounds.  RESPIRATORY: Normal rate. Clear to auscultation bilaterally.  ENDOCRINE: Normal thyroid.  MUSCULOSKELETAL: Normal ROM, strength equal bilaterally, significant muscle spasm noted SKIN: Warm, dry without erythema  NEUROLOGICAL: Alert, oriented, CN II-XII grossly intact, Appropriate balance  PSYCH: Normal behavior, mood   Assessment & Plan:  Assessment: 1. Sick headache   2. Migraine without aura and without status migrainosus, not intractable   3. Acute CVA (cerebrovascular accident) ([place])   4. Stress at work      Plan: Continue acute use of imitrex contingent on continued approval from Neuro.  She should discuss at every visit as changes to diagnosis and condition are possible.  She may be treated for migraine by Dr. Leonie Man also if desired.   Phenergan suppository for acute migraine +/_ nausea vomiting New medication of xanax to be used sparingly for her intense stress reactions.   Strongly urged to consider lifestyle changes: job change/stress reducing exercise/meditation, etc.   Follow-up in 12 months or sooner PRN  Paticia Stack, PA-C 12/30/2015 1:42 PM

## 2015-12-31 LAB — CUP PACEART REMOTE DEVICE CHECK: Date Time Interrogation Session: 20170505210704

## 2016-01-21 ENCOUNTER — Ambulatory Visit (INDEPENDENT_AMBULATORY_CARE_PROVIDER_SITE_OTHER): Payer: Commercial Managed Care - PPO | Admitting: *Deleted

## 2016-01-21 DIAGNOSIS — I639 Cerebral infarction, unspecified: Secondary | ICD-10-CM

## 2016-01-21 LAB — CUP PACEART REMOTE DEVICE CHECK: Date Time Interrogation Session: 20170604213508

## 2016-01-21 NOTE — Progress Notes (Signed)
[facility] Summary Report / Loop Recorder 

## 2016-02-03 LAB — CUP PACEART REMOTE DEVICE CHECK: MDC IDC SESS DTM: 20170704173307

## 2016-02-04 ENCOUNTER — Ambulatory Visit
Admission: RE | Admit: 2016-02-04 | Discharge: 2016-02-04 | Disposition: A | Discharge status: Home / Self Care | Payer: Commercial Managed Care - PPO | Source: Ambulatory Visit

## 2016-02-04 DIAGNOSIS — Z1231 Encounter for screening mammogram for malignant neoplasm of breast: Secondary | ICD-10-CM

## 2016-02-15 ENCOUNTER — Other Ambulatory Visit: Payer: Self-pay | Admitting: Neurology

## 2016-02-17 ENCOUNTER — Other Ambulatory Visit: Payer: Self-pay

## 2016-02-17 ENCOUNTER — Other Ambulatory Visit: Payer: Self-pay | Admitting: Neurology

## 2016-02-17 MED ORDER — ATORVASTATIN CALCIUM 10 MG PO TABS: 10.0000 mg | ORAL_TABLET | Freq: Every day | ORAL | 1 refills | Status: DC

## 2016-02-17 MED ORDER — ATORVASTATIN CALCIUM 10 MG PO TABS: 10.0000 mg | ORAL_TABLET | Freq: Every day | ORAL | 2 refills | Status: DC

## 2016-02-19 ENCOUNTER — Ambulatory Visit (INDEPENDENT_AMBULATORY_CARE_PROVIDER_SITE_OTHER): Payer: Commercial Managed Care - PPO | Admitting: *Deleted

## 2016-02-19 DIAGNOSIS — I639 Cerebral infarction, unspecified: Secondary | ICD-10-CM | POA: Diagnosis not present

## 2016-02-20 NOTE — Progress Notes (Signed)
[facility] Summary Report / Loop Recorder 

## 2016-03-01 LAB — CUP PACEART REMOTE DEVICE CHECK: Date Time Interrogation Session: 20170803223650

## 2016-03-23 ENCOUNTER — Ambulatory Visit (INDEPENDENT_AMBULATORY_CARE_PROVIDER_SITE_OTHER): Payer: Commercial Managed Care - PPO | Admitting: *Deleted

## 2016-03-23 DIAGNOSIS — I639 Cerebral infarction, unspecified: Secondary | ICD-10-CM | POA: Diagnosis not present

## 2016-03-23 NOTE — Progress Notes (Signed)
[facility] Summary Report / Loop Recorder 

## 2016-04-11 ENCOUNTER — Other Ambulatory Visit: Payer: Self-pay | Admitting: Cardiovascular Disease

## 2016-04-19 ENCOUNTER — Ambulatory Visit (INDEPENDENT_AMBULATORY_CARE_PROVIDER_SITE_OTHER): Payer: Commercial Managed Care - PPO | Admitting: *Deleted

## 2016-04-19 DIAGNOSIS — I639 Cerebral infarction, unspecified: Secondary | ICD-10-CM | POA: Diagnosis not present

## 2016-04-20 LAB — CUP PACEART REMOTE DEVICE CHECK: Date Time Interrogation Session: 20170902223526

## 2016-04-20 NOTE — Progress Notes (Signed)
Carelink summary report received. Battery status OK. Normal device function. No new symptom episodes, tachy episodes, brady, or pause episodes. No new AF episodes. Monthly summary reports and ROV/PRN 

## 2016-04-21 NOTE — Progress Notes (Signed)
[facility] Summary Report / Loop Recorder 

## 2016-04-28 ENCOUNTER — Other Ambulatory Visit: Payer: Self-pay | Admitting: Cardiovascular Disease

## 2016-04-28 ENCOUNTER — Other Ambulatory Visit: Payer: Self-pay | Admitting: Neurology

## 2016-04-28 ENCOUNTER — Other Ambulatory Visit: Payer: Self-pay

## 2016-04-30 ENCOUNTER — Ambulatory Visit: Payer: Commercial Managed Care - PPO | Admitting: Cardiovascular Disease

## 2016-05-10 ENCOUNTER — Encounter: Payer: Self-pay | Admitting: Cardiovascular Disease

## 2016-05-10 ENCOUNTER — Ambulatory Visit (INDEPENDENT_AMBULATORY_CARE_PROVIDER_SITE_OTHER): Payer: Commercial Managed Care - PPO | Admitting: Cardiovascular Disease

## 2016-05-10 VITALS — BP 136/86 | HR 54 | Ht 68.5 in | Wt 184.2 lb

## 2016-05-10 DIAGNOSIS — Z8673 Personal history of transient ischemic attack (TIA), and cerebral infarction without residual deficits: Secondary | ICD-10-CM | POA: Diagnosis not present

## 2016-05-10 DIAGNOSIS — E785 Hyperlipidemia, unspecified: Secondary | ICD-10-CM | POA: Diagnosis not present

## 2016-05-10 DIAGNOSIS — Z95818 Presence of other cardiac implants and grafts: Secondary | ICD-10-CM | POA: Diagnosis not present

## 2016-05-10 DIAGNOSIS — I10 Essential (primary) hypertension: Secondary | ICD-10-CM

## 2016-05-10 NOTE — Progress Notes (Signed)
Patient ID: Raven [name], female   DOB: January 15, 1956, 60 y.o.   MRN: [id number]     Cardiology Office Note    Date:  05/10/2016   ID:  Raven [name], DOB [birth year], MRN [id number]  PCP:  Thressa Sheller, MD  Cardiologist:   Sanda Klein, MD   Chief Complaint  Patient presents with  . Follow-up    no chest pain, has a loop monitor     History of Present Illness:  Raven [name] is a 60 y.o. female with hyperlipidemia, systemic hypertension and cryptogenic stroke that occurred roughly 6 months ago, returning for routine follow-up.  Her stroke presented with speech impairment last April and has resolved without any sequelae. Brain MRI showed "several small acute posterior left MCA territory cortical and white matter infarcts", CT angiogram of the head and neck did not show significant stenoses, TEE did not show a cardioembolic source of stroke. She has an implantable loop recorder that has not shown any atrial fibrillation today.  Her headaches have improved after reducing the dose of estrogen supplement. Her blood pressure has been consistently well controlled on the combination of nebivolol and amlodipine. She has not had problems with leg edema, constipation, dizziness, syncope, palpitations and she denies angina or dyspnea.  Past Medical History:  Diagnosis Date  . Abnormal Pap smear 08/2005  . Cancer (HCC)    cervical  . Diverticulosis   . History of migraines   . Hypertension   . Kidney stones 2012  . Menopause   . Migraine   . PFO (patent foramen ovale)   . Seasonal allergies   . Stroke [facility])     Past Surgical History:  Procedure Laterality Date  . BUNIONECTOMY  1987  . EP IMPLANTABLE DEVICE N/A 10/22/2015   Procedure: Loop Recorder Insertion;  Surgeon: Will Meredith Leeds, MD;  Location: [facility];  Service: Cardiovascular;  Laterality: N/A;  . NM MYOCAR PERF WALL MOTION  02/28/2008   normal  . partial mastectomy  2008  . TEE WITHOUT  CARDIOVERSION N/A 10/22/2015   Procedure: TRANSESOPHAGEAL ECHOCARDIOGRAM (TEE);  Surgeon: Skeet Latch, MD;  Location: [facility];  Service: Cardiovascular;  Laterality: N/A;  . TOTAL ABDOMINAL HYSTERECTOMY     cervical cancer  . TUBAL LIGATION  1988  . US ECHOCARDIOGRAPHY  03/21/2008   Trace MR,TR & PR, positive saline contrast bubble study    Outpatient Medications Prior to Visit  Medication Sig Dispense Refill  . ALPRAZolam (XANAX) 0.5 MG tablet Take 1 tablet (0.5 mg total) by mouth at bedtime as needed for anxiety. 20 tablet 0  . amLODipine (NORVASC) 2.5 MG tablet Take 1 tablet (2.5 mg total) by mouth daily. 90 tablet 3  . Ascorbic Acid (VITAMIN C) 1000 MG tablet Take 1,000 mg by mouth every evening.    Marland Kitchen aspirin 325 MG EC tablet Take 1 tablet (325 mg total) by mouth daily. 30 tablet 1  . atorvastatin (LIPITOR) 10 MG tablet TAKE 1 TABLET BY MOUTH DAILY AT 6 PM 30 tablet 3  . BYSTOLIC 10 MG tablet TAKE 1 TABLET (10 MG TOTAL) BY MOUTH DAILY. 90 tablet 0  . Calcium Carbonate-Vitamin D (CALCIUM + D PO) Take 1 tablet by mouth every evening.     Marland Kitchen esomeprazole (NEXIUM) 20 MG capsule Take 20 mg by mouth daily at 12 noon.    . Multiple Vitamin (MULTIVITAMIN PO) Take by mouth.      Marland Kitchen PREMARIN 1.25 MG tablet Take 0.625 mg by mouth  daily.   3  . Probiotic Product (PROBIOTIC FORMULA) CAPS Take 1 capsule by mouth every evening.    . promethazine (PHENERGAN) 25 MG suppository Place 1 suppository (25 mg total) rectally every 6 (six) hours as needed for nausea. 12 each 3  . SUMAtriptan (IMITREX) 100 MG tablet TAKE 1 TABLET (100 MG TOTAL) BY MOUTH AS NEEDED.  May use up to 200mg  per day.  No more 18 tablet 11   No facility-administered medications prior to visit.      Allergies:   Morphine and related; Codeine; and Sulfa antibiotics   Social History   Social History  . Marital status: Married    Spouse name: N/A  . Number of children: N/A  . Years of education: N/A   Social History Main  Topics  . Smoking status: Never Smoker  . Smokeless tobacco: Never Used  . Alcohol use 0.6 oz/week    1 Glasses of wine per week     Comment: rare / wine  . Drug use: No  . Sexual activity: Not Asked   Other Topics Concern  . None   Social History Narrative  . None     Family History:  The patient's family history includes Heart disease in her father and mother; Hypertension in her father.   ROS:   Please see the history of present illness.    ROS All other systems reviewed and are negative.   PHYSICAL EXAM:   VS:  BP 136/86 (BP Location: Left Arm, Patient Position: Sitting, Cuff Size: Normal)   Pulse (!) 54   Ht 5' 8.5" (1.74 m)   Wt 184 lb 4 oz (83.6 kg)   BMI 27.61 kg/m    GEN: Well nourished, well developed, in no acute distress  HEENT: normal  Neck: no JVD, carotid bruits, or masses Cardiac: RRR; no murmurs, rubs, or gallops,no edema, healthy loop recorder site  Respiratory:  clear to auscultation bilaterally, normal work of breathing GI: soft, nontender, nondistended, + BS MS: no deformity or atrophy  Skin: warm and dry, no rash Neuro:  Alert and Oriented x 3, Strength and sensation are intact Psych: euthymic mood, full affect  Wt Readings from Last 3 Encounters:  05/10/16 184 lb 4 oz (83.6 kg)  12/26/15 186 lb (84.4 kg)  12/24/15 185 lb 9.6 oz (84.2 kg)      Studies/Labs Reviewed:   EKG:  EKG is ordered today.  The ekg ordered today demonstrates mild sinus bradycardia, otherwise normal. QTC 401 ms.   ASSESSMENT:    1. Essential hypertension   2. Dyslipidemia   3. History of ischemic left MCA stroke   4. Status post placement of implantable loop recorder      PLAN:  In order of problems listed above:  1. HTN: Well-controlled. Beta blocker dose limited by bradycardia. Diuretics caused cramps in the past. Continue current combination. 2. HLP: Reevaluate current lipid profile before refilling her atorvastatin. 3. Hx of L MCA ischemic CVA: No  sequelae, remains unexplained. 4. ILR: No arrhythmia to date. Monitored by Dr. Curt Bears    Medication Adjustments/Labs and Tests Ordered: Current medicines are reviewed at length with the patient today.  Concerns regarding medicines are outlined above.  Medication changes, Labs and Tests ordered today are listed in the Patient Instructions below. There are no Patient Instructions on file for this visit.     Signed, Sanda Klein, MD  05/10/2016 4:14 PM    [address],  [place], [place]  [address] Phone: [phone]; Fax: [phone]

## 2016-05-10 NOTE — Patient Instructions (Signed)
Medication Instructions: Dr Croitoru recommends that you continue on your current medications as directed. Please refer to the Current Medication list given to you today.  Labwork: Your physician recommends that you return for lab work at your earliest convenience - FASTING.   Testing/Procedures: NONE ORDERED  Follow-up: Dr Croitoru recommends that you schedule a follow-up appointment in 12 months. You will receive a reminder letter in the mail two months in advance. If you don't receive a letter, please call our office to schedule the follow-up appointment.  If you need a refill on your cardiac medications before your next appointment, please call your pharmacy. 

## 2016-05-11 LAB — LIPID PANEL
CHOLESTEROL: 150 mg/dL (ref 125–200)
HDL: 59 mg/dL (ref >=46)
LDL Cholesterol: 69 mg/dL (ref <130)
TRIGLYCERIDES: 112 mg/dL (ref <150)
Total CHOL/HDL Ratio: 2.5 Ratio (ref <=5.0)
VLDL: 22 mg/dL (ref <30)

## 2016-05-19 ENCOUNTER — Ambulatory Visit (INDEPENDENT_AMBULATORY_CARE_PROVIDER_SITE_OTHER): Payer: Commercial Managed Care - PPO | Admitting: *Deleted

## 2016-05-19 DIAGNOSIS — I639 Cerebral infarction, unspecified: Secondary | ICD-10-CM | POA: Diagnosis not present

## 2016-05-20 NOTE — Progress Notes (Signed)
[facility] Summary Report / Loop Recorder 

## 2016-05-29 LAB — CUP PACEART REMOTE DEVICE CHECK
Implantable Pulse Generator Implant Date: 20170405
MDC IDC SESS DTM: 20171003000808

## 2016-05-29 NOTE — Progress Notes (Signed)
Carelink summary report received. Battery status OK. Normal device function. No new symptom episodes, tachy episodes, brady, or pause episodes. No new AF episodes. Monthly summary reports and ROV/PRN 

## 2016-06-18 ENCOUNTER — Ambulatory Visit (INDEPENDENT_AMBULATORY_CARE_PROVIDER_SITE_OTHER): Payer: Commercial Managed Care - PPO | Admitting: *Deleted

## 2016-06-18 DIAGNOSIS — I639 Cerebral infarction, unspecified: Secondary | ICD-10-CM

## 2016-06-21 NOTE — Progress Notes (Signed)
[facility] Summary Report / Loop Recorder 

## 2016-06-22 ENCOUNTER — Other Ambulatory Visit: Payer: Self-pay | Admitting: *Deleted

## 2016-06-22 MED ORDER — ATORVASTATIN CALCIUM 10 MG PO TABS: ORAL_TABLET | ORAL | 0 refills | Status: DC

## 2016-06-24 ENCOUNTER — Ambulatory Visit (INDEPENDENT_AMBULATORY_CARE_PROVIDER_SITE_OTHER): Payer: Commercial Managed Care - PPO | Admitting: Neurology

## 2016-06-24 ENCOUNTER — Encounter: Payer: Self-pay | Admitting: Neurology

## 2016-06-24 VITALS — BP 133/88 | HR 55 | Wt 188.2 lb

## 2016-06-24 DIAGNOSIS — I639 Cerebral infarction, unspecified: Secondary | ICD-10-CM | POA: Diagnosis not present

## 2016-06-24 NOTE — Progress Notes (Signed)
[facility] [address] [place]. [place] [address] [phone]       OFFICE FOLLOW-UP NOTE  Raven. [name] [name] Date of Birth:  03/10/1956 Medical Record Number:  [id number]   HPI:  Visit 12/24/2015 : Raven [name] is a 45 year Caucasian lady seen today for first office follow-up visit following hospital admission for stroke in April 2017.[name] [name] is an 60 y.o. female patient who was brought into the emergency room on 10/20/2015 for further evaluation of speech problems she started having that morning. They've been occurring intermittently where she would slur and could not get certain words out properly. This type of slurring of the speech lasted couple of minutes at a time and happen multiple times throughout this morning. She did not have any trouble understanding her husband. Intermittent in between the episodes of slurred speech, she had fluent speech and able to speak full sentences without problem. Denies any other neurological symptoms in addition to this, no numbness in her upper or lower extremities or on the face. She did have some subjective paresthesias in the chin bilaterally but when she touches the fingers she did not have any numbness. No vision problems no motor weakness or gait difficulty or no vertigo.She never had any strokes before.She has very frequent migraines occurring 3-4 times a week and she has been in a clinical trial for migraine prophylactic therapy using monoclonal antibodies for the past 11 months. This is once a month injection of monoclonal antibodies. She has not noticed a significant improvement in her migraine frequency while she was on the trial. Otherwise she does not take any other prophylactic medication for migraine. She is on a couple of blood pressure pills. She takes Imitrex as needed. She hasn't taken Imitrex Imitrex this morning. She denies any headache or migraine symptoms this morning prior to onset of her sleep symptoms.  She denies any headache at this time.  Date last known well: 2017, April 3rd Time last known well: At 9 AM tPA Given: No: Nonfocal neurological exam. MRI scan of the brain showed a small punctate left frontal cortical infarct. MRA of the brain and carotid ultrasound showed no significant large vessel stenosis or occlusion. MRA of the brain showed no large vessel stenosis or occlusion. Carotid ultrasound was unremarkable. Transthoracic echo showed normal ejection fraction. Patient hadn't known history of PFO but lower extremity venous Dopplers were negative for DVT. LDL cholesterol was 90 mg percent and hemoglobin A1c was 5.1. She has started on aspirin and Lipitor and states she is doing well. She has had no bleeding or bruising. She is tolerating Lipitor without muscle aches and pains. Blood pressure is well controlled and today it is 132/83. She had TEE which showed no obvious cardiac source of embolism except PFO. Loop recorder has been inserted and so for paroxysmal atrial fibrillation have not yet been found. Patient admits to significant stress in her job and reports almost daily flushing of the cheeks as well as sweating over the shoulders which may be perhaps stress related. She has finished part sparing in a migraine recently but is yet in the follow-up phase. She has bought a elliptical and started exercising but has not lost any weight. She has been full neurological recovery and has no residual symptoms of deficits. Update 06/24/2016 : She returns for follow-up after last visit 6 months ago. She continues to do well without recurrent stroke or TIA symptoms. She's been full neurological recovery from her stroke in April  this year. She is starting aspirin well without bleeding or bruising or stomach upset. She states her blood pressure is well controlled and today it is 133/88 in our office. She is tolerating Lipitor well without side effects and had follow-up lipid profile checked 6 weeks ago by  her medical doctor and was satisfactory. She sustained an ankle sprain in August and has not been physically very active and in fact has gained about 5 pounds. She however is careful about her diet and she plans to exercise soon again. She has no new neurological complaints. ROS:   14 system review of systems is positive for  Back pain, ankle sprain, weight gain and all other systems negative PMH:  Past Medical History:  Diagnosis Date  . Abnormal Pap smear 08/2005  . Cancer (HCC)    cervical  . Diverticulosis   . History of migraines   . Hypertension   . Kidney stones 2012  . Menopause   . Migraine   . PFO (patent foramen ovale)   . Seasonal allergies   . Stroke [facility])     Social History:  Social History   Social History  . Marital status: Married    Spouse name: N/A  . Number of children: N/A  . Years of education: N/A   Occupational History  . Not on file.   Social History Main Topics  . Smoking status: Never Smoker  . Smokeless tobacco: Never Used  . Alcohol use 0.6 oz/week    1 Glasses of wine per week     Comment: rare / wine  . Drug use: No  . Sexual activity: Not on file   Other Topics Concern  . Not on file   Social History Narrative  . No narrative on file    Medications:   Current Outpatient Prescriptions on File Prior to Visit  Medication Sig Dispense Refill  . ALPRAZolam (XANAX) 0.5 MG tablet Take 1 tablet (0.5 mg total) by mouth at bedtime as needed for anxiety. 20 tablet 0  . amLODipine (NORVASC) 2.5 MG tablet Take 1 tablet (2.5 mg total) by mouth daily. 90 tablet 3  . Ascorbic Acid (VITAMIN C) 1000 MG tablet Take 1,000 mg by mouth every evening.    Marland Kitchen aspirin 325 MG EC tablet Take 1 tablet (325 mg total) by mouth daily. 30 tablet 1  . atorvastatin (LIPITOR) 10 MG tablet TAKE 1 TABLET BY MOUTH DAILY AT 6 PM 90 tablet 0  . BYSTOLIC 10 MG tablet TAKE 1 TABLET (10 MG TOTAL) BY MOUTH DAILY. 90 tablet 0  . Calcium Carbonate-Vitamin D (CALCIUM + D PO)  Take 1 tablet by mouth every evening.     Marland Kitchen esomeprazole (NEXIUM) 20 MG capsule Take 20 mg by mouth daily at 12 noon.    . Multiple Vitamin (MULTIVITAMIN PO) Take by mouth.      Marland Kitchen PREMARIN 1.25 MG tablet Take 0.625 mg by mouth daily.   3  . Probiotic Product (PROBIOTIC FORMULA) CAPS Take 1 capsule by mouth every evening.    . promethazine (PHENERGAN) 25 MG suppository Place 1 suppository (25 mg total) rectally every 6 (six) hours as needed for nausea. 12 each 3  . SUMAtriptan (IMITREX) 100 MG tablet TAKE 1 TABLET (100 MG TOTAL) BY MOUTH AS NEEDED.  May use up to 200mg  per day.  No more 18 tablet 11   No current facility-administered medications on file prior to visit.     Allergies:   Allergies  Allergen Reactions  .  Morphine And Related Nausea Only and Other (See Comments)    "not well tolerated"  . Codeine Nausea Only and Other (See Comments)    "not well tolerated"  . Sulfa Antibiotics Itching    Physical Exam General: well developed, well nourished Middle-age Caucasian lady, seated, in no evident distress Head: head normocephalic and atraumatic.  Neck: supple with no carotid or supraclavicular bruits Cardiovascular: regular rate and rhythm, no murmurs Musculoskeletal: no deformity Skin:  no rash/petichiae Vascular:  Normal pulses all extremities Vitals:   06/24/16 0936  BP: 133/88  Pulse: (!) 55   Neurologic Exam Mental Status: Awake and fully alert. Oriented to place and time. Recent and remote memory intact. Attention span, concentration and fund of knowledge appropriate. Mood and affect appropriate.  Cranial Nerves: Fundoscopic exam not done.Pupils equal, briskly reactive to light. Extraocular movements full without nystagmus. Visual fields full to confrontation. Hearing intact. Facial sensation intact. Face, tongue, palate moves normally and symmetrically.  Motor: Normal bulk and tone. Normal strength in all tested extremity muscles. Sensory.: intact to touch ,pinprick  .position and vibratory sensation.  Coordination: Rapid alternating movements normal in all extremities. Finger-to-nose and heel-to-shin performed accurately bilaterally. Gait and Station: Arises from chair without difficulty. Stance is normal. Gait demonstrates normal stride length and balance . Able to heel, toe and tandem walk without difficulty.  Reflexes: 1+ and symmetric. Toes downgoing.   NIHSS  0 Modified Rankin  0  ASSESSMENT: 24 year Caucasian lady with embolic left frontal MCA branch infarct in April 2017 of cryptogenic etiology. Vascular risk factors of hyperlipidemia , PFO and hypertension.    PLAN: I had a long d/w patient about her recent  Cryptogenic stroke,PFO, risk for recurrent stroke/TIAs, personally independently reviewed imaging studies and stroke evaluation results and answered questions.Continue aspirin 325 mg daily  for secondary stroke prevention and maintain strict control of hypertension with blood pressure goal below 130/90, diabetes with hemoglobin A1c goal below 6.5% and lipids with LDL cholesterol goal below 70 mg/dL. I also advised the patient to eat a healthy diet with plenty of whole grains, cereals, fruits and vegetables, exercise regularly and maintain ideal body weight. We talked about results from recent studies indicating endovascular PFO closure being found to be superior to medical therapy for stroke prevention but she is content on pursuing medical management at the present time. Followup in the future with me in  one year or call earlier if necessary.Greater than 50% of time during this 30 minute visit was spent on counseling,explanation of diagnosis, planning of further management, discussion with patient ly and coordination of care Antony Contras, MD Medical Director [facility] Pager: [phone] 06/24/2016 10:17 AM  Note: This document was prepared with digital dictation and possible smart phrase technology. Any transcriptional errors  that result from this process are unintentional

## 2016-06-26 LAB — CUP PACEART REMOTE DEVICE CHECK
MDC IDC PG IMPLANT DT: 20170405
MDC IDC SESS DTM: 20171102003534

## 2016-06-26 NOTE — Progress Notes (Signed)
Carelink summary report received. Battery status OK. Normal device function. No new symptom episodes, tachy episodes, brady, or pause episodes. No new AF episodes. Monthly summary reports and ROV/PRN 

## 2016-07-20 ENCOUNTER — Ambulatory Visit (INDEPENDENT_AMBULATORY_CARE_PROVIDER_SITE_OTHER): Payer: Commercial Managed Care - PPO | Admitting: *Deleted

## 2016-07-20 DIAGNOSIS — I639 Cerebral infarction, unspecified: Secondary | ICD-10-CM | POA: Diagnosis not present

## 2016-07-21 NOTE — Progress Notes (Signed)
[facility] Summary Report 

## 2016-07-23 ENCOUNTER — Other Ambulatory Visit: Payer: Self-pay | Admitting: Cardiovascular Disease

## 2016-07-26 NOTE — Telephone Encounter (Signed)
Rx(s) sent to pharmacy electronically.  

## 2016-07-28 LAB — CUP PACEART REMOTE DEVICE CHECK
Implantable Pulse Generator Implant Date: 20170405
MDC IDC SESS DTM: 20171202013610

## 2016-08-17 ENCOUNTER — Ambulatory Visit (INDEPENDENT_AMBULATORY_CARE_PROVIDER_SITE_OTHER): Payer: Commercial Managed Care - PPO | Admitting: *Deleted

## 2016-08-17 DIAGNOSIS — I639 Cerebral infarction, unspecified: Secondary | ICD-10-CM

## 2016-08-18 NOTE — Progress Notes (Signed)
[facility] Summary Report / Loop Recorder 

## 2016-08-19 ENCOUNTER — Other Ambulatory Visit: Payer: Self-pay | Admitting: Cardiovascular Disease

## 2016-08-30 LAB — CUP PACEART REMOTE DEVICE CHECK
Date Time Interrogation Session: 20180101014145
Implantable Pulse Generator Implant Date: 20170405

## 2016-09-16 ENCOUNTER — Ambulatory Visit (INDEPENDENT_AMBULATORY_CARE_PROVIDER_SITE_OTHER): Payer: Commercial Managed Care - PPO | Admitting: *Deleted

## 2016-09-16 DIAGNOSIS — I639 Cerebral infarction, unspecified: Secondary | ICD-10-CM

## 2016-09-16 LAB — CUP PACEART REMOTE DEVICE CHECK
Date Time Interrogation Session: 20180131024203
MDC IDC PG IMPLANT DT: 20170405

## 2016-09-20 NOTE — Progress Notes (Signed)
[facility] Summary Report / Loop Recorder 

## 2016-10-01 LAB — CUP PACEART REMOTE DEVICE CHECK
Date Time Interrogation Session: 20180302023623
MDC IDC PG IMPLANT DT: 20170405

## 2016-10-18 ENCOUNTER — Ambulatory Visit (INDEPENDENT_AMBULATORY_CARE_PROVIDER_SITE_OTHER): Payer: Commercial Managed Care - PPO | Admitting: *Deleted

## 2016-10-18 DIAGNOSIS — I639 Cerebral infarction, unspecified: Secondary | ICD-10-CM

## 2016-10-18 NOTE — Progress Notes (Signed)
[facility] Summary Report / Loop Recorder 

## 2016-10-21 LAB — CUP PACEART REMOTE DEVICE CHECK
Implantable Pulse Generator Implant Date: 20170405
MDC IDC SESS DTM: 20180401034144

## 2016-11-06 ENCOUNTER — Other Ambulatory Visit: Payer: Self-pay | Admitting: Neurology

## 2016-11-09 ENCOUNTER — Other Ambulatory Visit: Payer: Self-pay

## 2016-11-09 MED ORDER — ATORVASTATIN CALCIUM 10 MG PO TABS: ORAL_TABLET | ORAL | 2 refills | Status: DC

## 2016-11-15 ENCOUNTER — Ambulatory Visit (INDEPENDENT_AMBULATORY_CARE_PROVIDER_SITE_OTHER): Payer: Commercial Managed Care - PPO | Admitting: *Deleted

## 2016-11-15 DIAGNOSIS — I639 Cerebral infarction, unspecified: Secondary | ICD-10-CM | POA: Diagnosis not present

## 2016-11-16 NOTE — Progress Notes (Signed)
[facility] Summary Report / Loop Recorder 

## 2016-11-28 LAB — CUP PACEART REMOTE DEVICE CHECK
Date Time Interrogation Session: 20180501041214
MDC IDC PG IMPLANT DT: 20170405

## 2016-11-28 NOTE — Progress Notes (Signed)
Carelink summary report received. Battery status OK. Normal device function. No new symptom episodes, tachy episodes, brady, or pause episodes. No new AF episodes. Monthly summary reports and ROV/PRN 

## 2016-12-15 ENCOUNTER — Ambulatory Visit (INDEPENDENT_AMBULATORY_CARE_PROVIDER_SITE_OTHER): Payer: Commercial Managed Care - PPO | Admitting: *Deleted

## 2016-12-15 DIAGNOSIS — I639 Cerebral infarction, unspecified: Secondary | ICD-10-CM | POA: Diagnosis not present

## 2016-12-16 NOTE — Progress Notes (Signed)
[facility] Summary Report 

## 2016-12-17 LAB — CUP PACEART REMOTE DEVICE CHECK
Implantable Pulse Generator Implant Date: 20170405
MDC IDC SESS DTM: 20180531043928

## 2017-01-14 ENCOUNTER — Ambulatory Visit (INDEPENDENT_AMBULATORY_CARE_PROVIDER_SITE_OTHER): Payer: Commercial Managed Care - PPO | Admitting: *Deleted

## 2017-01-14 DIAGNOSIS — I639 Cerebral infarction, unspecified: Secondary | ICD-10-CM | POA: Diagnosis not present

## 2017-01-17 ENCOUNTER — Ambulatory Visit: Payer: Commercial Managed Care - PPO | Admitting: *Deleted

## 2017-01-17 NOTE — Progress Notes (Signed)
[facility] Summary Report / Loop Recorder 

## 2017-01-18 ENCOUNTER — Other Ambulatory Visit: Payer: Self-pay | Admitting: Physician Assistant

## 2017-01-18 DIAGNOSIS — G43909 Migraine, unspecified, not intractable, without status migrainosus: Secondary | ICD-10-CM

## 2017-01-24 LAB — CUP PACEART REMOTE DEVICE CHECK
Implantable Pulse Generator Implant Date: 20170405
MDC IDC SESS DTM: 20180630082711

## 2017-02-03 ENCOUNTER — Telehealth: Payer: Self-pay | Admitting: *Deleted

## 2017-02-03 DIAGNOSIS — G43909 Migraine, unspecified, not intractable, without status migrainosus: Secondary | ICD-10-CM

## 2017-02-03 MED ORDER — SUMATRIPTAN SUCCINATE 100 MG PO TABS: ORAL_TABLET | ORAL | 1 refills | Status: DC

## 2017-02-03 NOTE — Telephone Encounter (Signed)
Refill for Imitrex sent to the pharmacy.

## 2017-02-03 NOTE — Telephone Encounter (Signed)
-----   Message from Blanchie Dessert, [place] sent at 02/03/2017  9:12 AM EDT ----- Regarding: Headache medication refill Please call in refill for headache [facility] to [facility] [address]. Pt is scheduled for appt with [facility] 04/01/17

## 2017-02-14 ENCOUNTER — Ambulatory Visit (INDEPENDENT_AMBULATORY_CARE_PROVIDER_SITE_OTHER): Payer: Commercial Managed Care - PPO | Admitting: *Deleted

## 2017-02-14 DIAGNOSIS — I639 Cerebral infarction, unspecified: Secondary | ICD-10-CM | POA: Diagnosis not present

## 2017-02-14 NOTE — Progress Notes (Signed)
[facility] Summary Report / Loop Recorder 

## 2017-02-26 LAB — CUP PACEART REMOTE DEVICE CHECK
Date Time Interrogation Session: 20180730121121
MDC IDC PG IMPLANT DT: 20170405

## 2017-03-16 ENCOUNTER — Ambulatory Visit (INDEPENDENT_AMBULATORY_CARE_PROVIDER_SITE_OTHER): Payer: Commercial Managed Care - PPO | Admitting: *Deleted

## 2017-03-16 DIAGNOSIS — I639 Cerebral infarction, unspecified: Secondary | ICD-10-CM

## 2017-03-17 LAB — CUP PACEART REMOTE DEVICE CHECK
MDC IDC PG IMPLANT DT: 20170405
MDC IDC SESS DTM: 20180829224155

## 2017-03-17 NOTE — Progress Notes (Signed)
[facility] Summary Report / Loop Recorder 

## 2017-04-01 ENCOUNTER — Encounter: Payer: Commercial Managed Care - PPO | Admitting: Physician Assistant

## 2017-04-02 ENCOUNTER — Other Ambulatory Visit: Payer: Self-pay | Admitting: Physician Assistant

## 2017-04-02 DIAGNOSIS — G43909 Migraine, unspecified, not intractable, without status migrainosus: Secondary | ICD-10-CM

## 2017-04-06 ENCOUNTER — Encounter: Payer: Self-pay | Admitting: *Deleted

## 2017-04-06 NOTE — Telephone Encounter (Signed)
-----   Message from Blanchie Dessert, [place] sent at 04/06/2017  9:26 AM EDT ----- Regarding: refill for Imitrex  Contact: [phone] Please call in refill [facility] to [facility] [address] pt has an appointment in Oct with [facility]

## 2017-04-06 NOTE — Telephone Encounter (Signed)
-----   Message from Blanchie Dessert, [place] sent at 04/06/2017  9:26 AM EDT ----- Regarding: refill for Imitrex  Contact: [phone] Please call in refill [facility] to [facility] [address] pt has an appointment in Oct with [facility]

## 2017-04-15 ENCOUNTER — Ambulatory Visit (INDEPENDENT_AMBULATORY_CARE_PROVIDER_SITE_OTHER): Payer: Commercial Managed Care - PPO | Admitting: *Deleted

## 2017-04-15 DIAGNOSIS — I639 Cerebral infarction, unspecified: Secondary | ICD-10-CM | POA: Diagnosis not present

## 2017-04-18 NOTE — Progress Notes (Signed)
[facility] Summary Report / Loop Recorder 

## 2017-04-19 LAB — CUP PACEART REMOTE DEVICE CHECK
Date Time Interrogation Session: 20180928223742
MDC IDC PG IMPLANT DT: 20170405

## 2017-05-08 IMAGING — MR MR HEAD WO/W CM
11 of 13 series · 36 of 48 positions shown · IV contrast (multihance)
Comparison: Noncontrast head CT 3960 hours today.

CLINICAL DATA: 60-year-old female with intermittent slurred speech
since this morning. No motor weakness. Initial encounter.

EXAM:
MRI HEAD WITHOUT AND WITH CONTRAST
TECHNIQUE: Multiplanar, multiecho pulse sequences of the brain and surrounding
structures were obtained without and with intravenous contrast.
CONTRAST:  17mL MULTIHANCE GADOBENATE DIMEGLUMINE 529 MG/ML IV SOLN

[Series 3: T1 · sagittal · 5.0mm · 0.47mm/px · 3 of 25 slices shown]
[im 1/25]
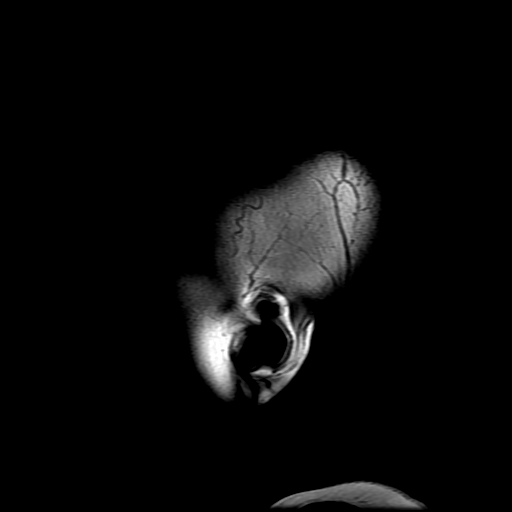
[im 13/25]
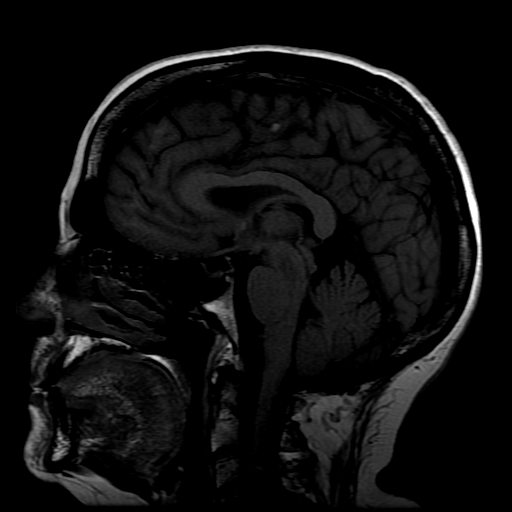
[im 25/25]
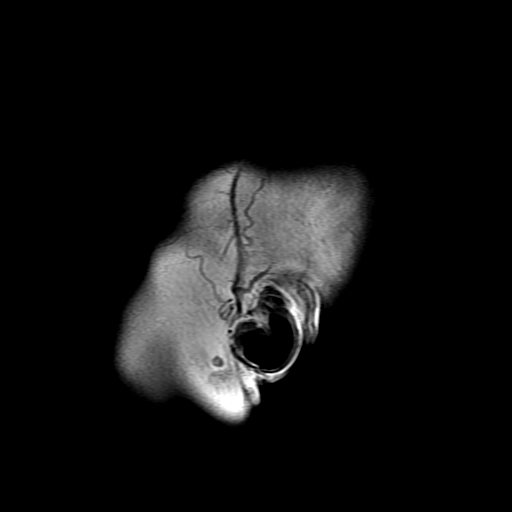

[Series 4: DWI · axial · 3.0mm · 1.09mm/px · z∈[-7,+139]mm · 8 of 104 slices shown (1 of 4)]
[im 1/104]
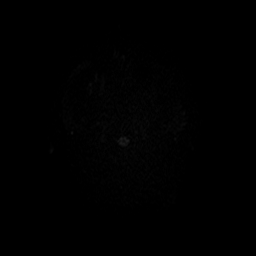
[im 15/104]
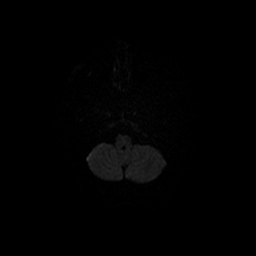
[im 30/104]
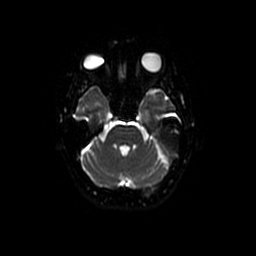
[im 45/104]
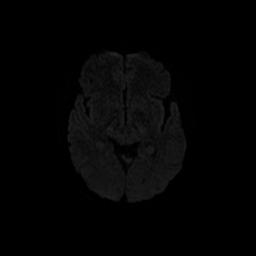
[im 59/104]
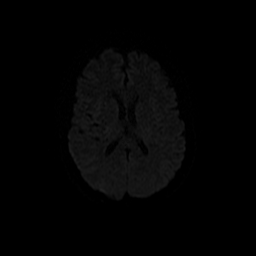
[im 74/104]
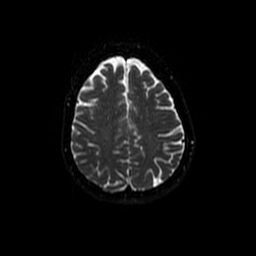
[im 89/104]
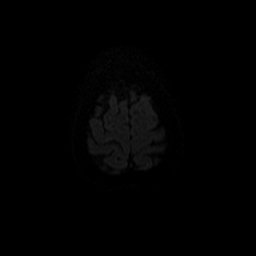
[im 104/104]
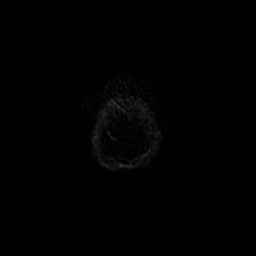

[Series 5: T2 · axial · 5.0mm · 0.43mm/px · z∈[-15,+128]mm · 2 of 26 slices shown]
[im 1/26]
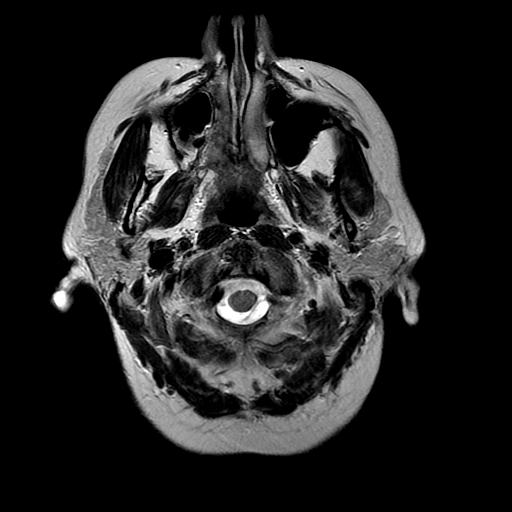
[im 26/26]
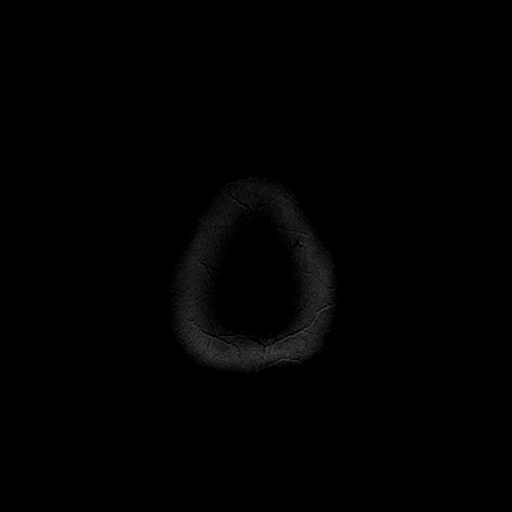

[Series 6: DWI · coronal · 5.0mm · 1.09mm/px · 6 of 76 slices shown (2 of 4)]
[im 1/76]
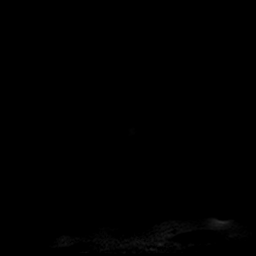
[im 16/76]
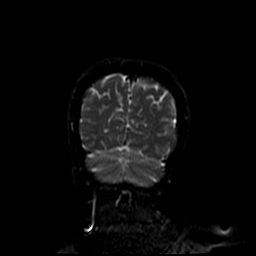
[im 31/76]
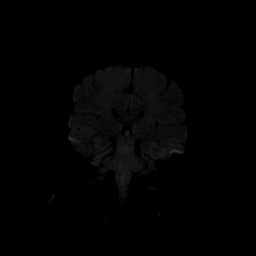
[im 46/76]
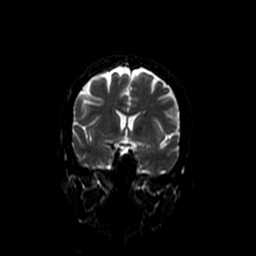
[im 61/76]
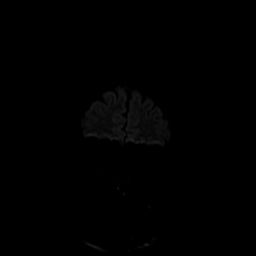
[im 76/76]
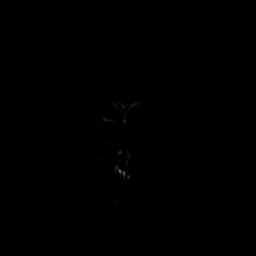

[Series 7: FLAIR · axial · 5.0mm · 0.43mm/px · z∈[-15,+128]mm · 2 of 26 slices shown]
[im 1/26]
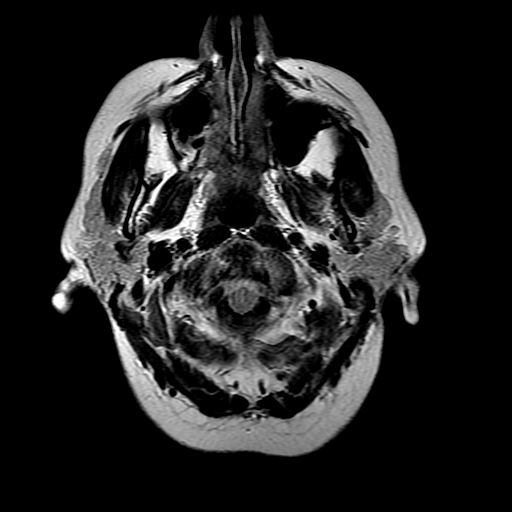
[im 26/26]
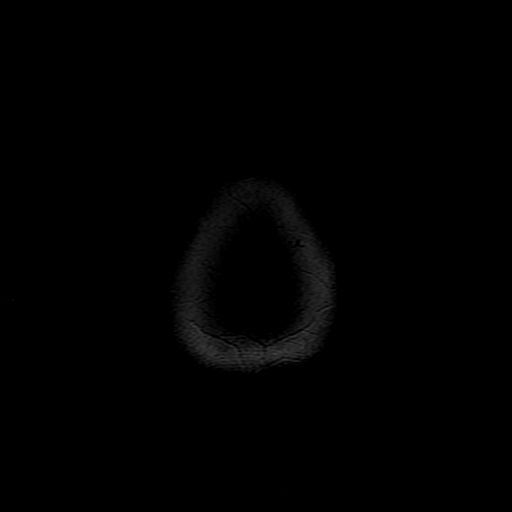

[Series 8: ax mpgr · axial · 5.0mm · 0.43mm/px · z∈[-15,+128]mm · 2 of 26 slices shown]
[im 1/26]
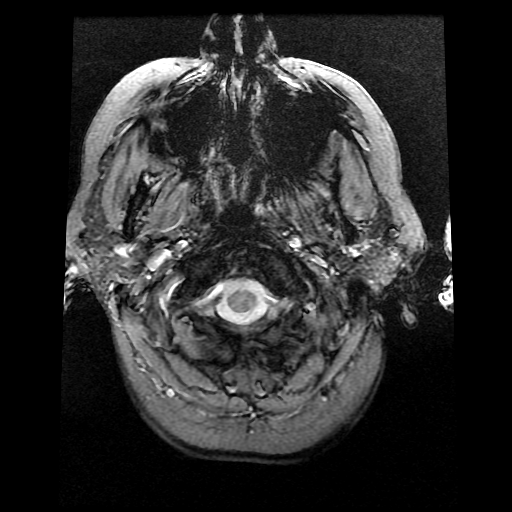
[im 26/26]
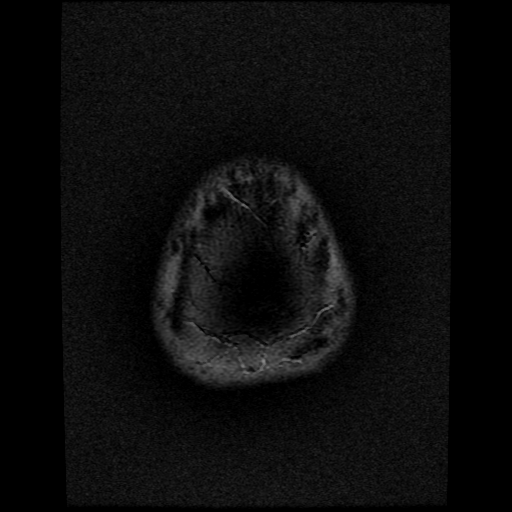

[Series 10: T2 post-contrast · coronal · 5.0mm · 0.78mm/px · 2 of 29 slices shown]
[im 1/29]
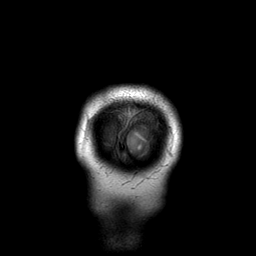
[im 29/29]
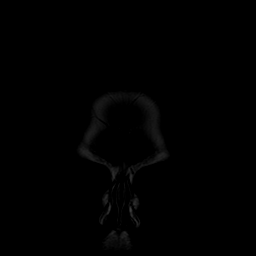

[Series 12: T1 post-contrast · coronal · 5.0mm · 0.78mm/px · 2 of 29 slices shown (1 of 2)]
[im 1/29]
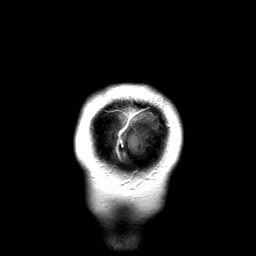
[im 29/29]
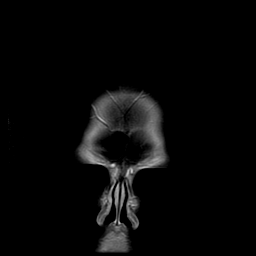

[Series 13: T1 post-contrast · sagittal · 5.0mm · 0.47mm/px · 2 of 25 slices shown (2 of 2)]
[im 1/25]
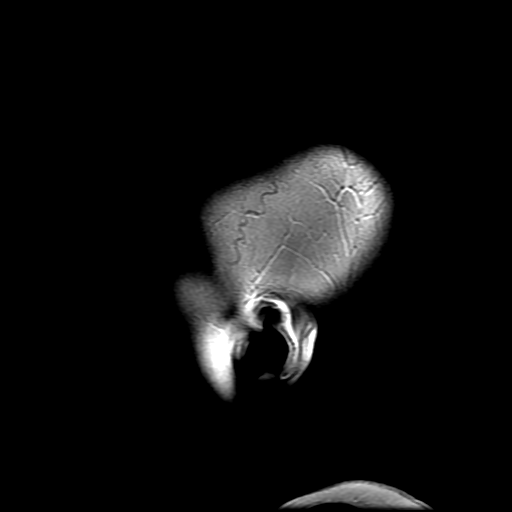
[im 25/25]
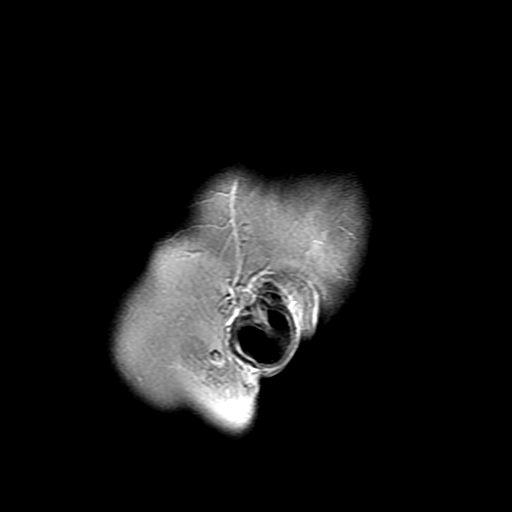

[Series 400: DWI · axial · 3.0mm · 1.09mm/px · z∈[-7,+139]mm · 4 of 52 slices shown (3 of 4)]
[im 1/52]
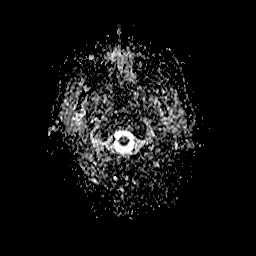
[im 18/52]
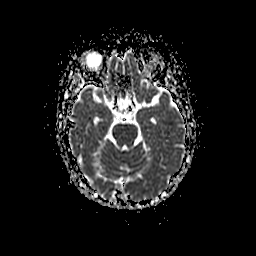
[im 35/52]
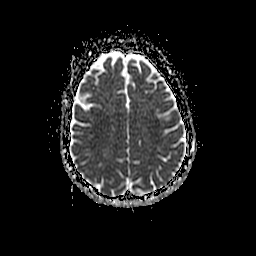
[im 52/52]
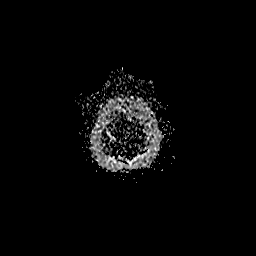

[Series 600: DWI · coronal · 5.0mm · 1.09mm/px · 3 of 38 slices shown (4 of 4)]
[im 1/38]
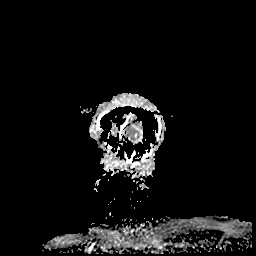
[im 19/38]
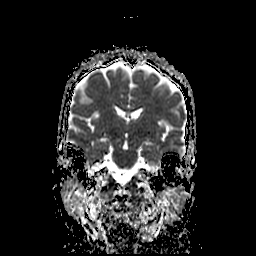
[im 38/38]
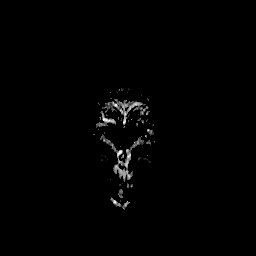

[36 of 48 positions shown; findings below may reference images not displayed]

FINDINGS: There is a small cortically based infarct along the left
peri-Rolandic cortex knee near the area of facial representation on
series 4, image 38. There are 1 or 2 additional tiny punctate
subcortical infarcts in the more superior left frontal gyrus (series
4, image 40 and series 6, image 14). Major intracranial vascular
flow voids are preserved.

No contralateral right hemisphere or posterior fossa restricted
diffusion. There is mild T2 and FLAIR hyperintensity associated with
the small cortical infarct (series 7, image 19) with no associated
hemorrhage or mass effect.

Elsewhere gray and white matter signal is normal for age. No chronic
cerebral blood products or cortical encephalomalacia identified. No
abnormal enhancement identified. No dural thickening. No midline
shift, mass effect, evidence of mass lesion, ventriculomegaly,
extra-axial collection or acute intracranial hemorrhage.
Cervicomedullary junction and pituitary are within normal limits.

Negative visualized cervical spine. Visualized bone marrow signal is
within normal limits. Visible internal auditory structures appear
normal. Mastoids are clear. Paranasal sinuses are clear. Negative
orbit and scalp soft tissues.
IMPRESSION: 1. Several small acute posterior left MCA territory cortical and
white matter infarcts with no associated hemorrhage or mass effect.
2. Otherwise negative for age MRI appearance of the brain.

## 2017-05-13 ENCOUNTER — Ambulatory Visit (INDEPENDENT_AMBULATORY_CARE_PROVIDER_SITE_OTHER): Payer: Commercial Managed Care - PPO | Admitting: Physician Assistant

## 2017-05-13 ENCOUNTER — Encounter: Payer: Self-pay | Admitting: Physician Assistant

## 2017-05-13 DIAGNOSIS — G43909 Migraine, unspecified, not intractable, without status migrainosus: Secondary | ICD-10-CM

## 2017-05-13 DIAGNOSIS — G43009 Migraine without aura, not intractable, without status migrainosus: Secondary | ICD-10-CM | POA: Diagnosis not present

## 2017-05-13 DIAGNOSIS — Z566 Other physical and mental strain related to work: Secondary | ICD-10-CM | POA: Diagnosis not present

## 2017-05-13 DIAGNOSIS — M62838 Other muscle spasm: Secondary | ICD-10-CM

## 2017-05-13 MED ORDER — SUMATRIPTAN SUCCINATE 100 MG PO TABS: 100.0000 mg | ORAL_TABLET | ORAL | 11 refills | Status: DC | PRN

## 2017-05-13 MED ORDER — PROMETHAZINE HCL 25 MG RE SUPP: 25.0000 mg | Freq: Four times a day (QID) | RECTAL | 3 refills | Status: DC | PRN

## 2017-05-13 NOTE — Patient Instructions (Signed)

## 2017-05-13 NOTE — Progress Notes (Signed)
History:  Raven [name] is a 61 y.o. [phone] who presents to clinic today for yearly headache evaluation.  She has not had any changes in her medical history since last seen.  Although she had significant workup, the cause her her 2017 stroke has not been identified.  Her neurologist has indicated, she does not need to make significant changes to her medications.  She is continuing to use Imitrex for acute treatment.  She has decreased her estrogen dosing, but is continuing to use.  She noticed some improvement in her HAs when she decreased the dose of estrogen but is unclear if that has been sustained.  She does feel the HA's are worse with the fall allergy season.    HIT6:64 Number of days in the last 4 weeks with:  Severe headache: 15 Moderate headache: 0 Mild headache: 0  No headache: 15   Past Medical History:  Diagnosis Date  . Abnormal Pap smear 08/2005  . Cancer (HCC)    cervical  . Diverticulosis   . History of migraines   . Hypertension   . Kidney stones 2012  . Menopause   . Migraine   . PFO (patent foramen ovale)   . Seasonal allergies   . Stroke [facility])     Social History   Social History  . Marital status: Married    Spouse name: N/A  . Number of children: N/A  . Years of education: N/A   Occupational History  . Not on file.   Social History Main Topics  . Smoking status: Never Smoker  . Smokeless tobacco: Never Used  . Alcohol use 0.6 oz/week    1 Glasses of wine per week     Comment: rare / wine  . Drug use: No  . Sexual activity: Not on file   Other Topics Concern  . Not on file   Social History Narrative  . No narrative on file    Family History  Problem Relation Age of Onset  . Heart disease Mother        heart attack  . Heart disease Father        heart attack  . Hypertension Father     Allergies  Allergen Reactions  . Morphine And Related Nausea Only and Other (See Comments)    "not well tolerated"  . Codeine Nausea Only and  Other (See Comments)    "not well tolerated"  . Sulfa Antibiotics Itching    Current Outpatient Prescriptions on File Prior to Visit  Medication Sig Dispense Refill  . amLODipine (NORVASC) 2.5 MG tablet TAKE 1 TABLET (2.5 MG TOTAL) BY MOUTH DAILY. 90 tablet 2  . Ascorbic Acid (VITAMIN C) 1000 MG tablet Take 1,000 mg by mouth every evening.    Marland Kitchen aspirin 325 MG EC tablet Take 1 tablet (325 mg total) by mouth daily. 30 tablet 1  . atorvastatin (LIPITOR) 10 MG tablet TAKE 1 TABLET BY MOUTH DAILY AT 6 PM 90 tablet 2  . BYSTOLIC 10 MG tablet TAKE 1 TABLET BY MOUTH EVERY DAY 90 tablet 3  . Calcium Carbonate-Vitamin D (CALCIUM + D PO) Take 1 tablet by mouth every evening.     Marland Kitchen esomeprazole (NEXIUM) 20 MG capsule Take 20 mg by mouth daily at 12 noon.    . Multiple Vitamin (MULTIVITAMIN PO) Take by mouth.      . Probiotic Product (PROBIOTIC FORMULA) CAPS Take 1 capsule by mouth every evening.    . SUMAtriptan (IMITREX) 100 MG tablet  TAKE 1 TABLET (100 MG TOTAL) AS NEEDED. MAY USE UP TO 200MG  PER DAY. NO MORE 18 tablet 1  . promethazine (PHENERGAN) 25 MG suppository Place 1 suppository (25 mg total) rectally every 6 (six) hours as needed for nausea. 12 each 3   No current facility-administered medications on file prior to visit.      Review of Systems:  All pertinent positive/negative included in HPI, all other review of systems are negative   Objective:  Physical Exam BP (!) 159/75   Pulse 69   Ht 5\' 9"  (1.753 m)   Wt 187 lb 6.4 oz (85 kg)   BMI 27.67 kg/m  CONSTITUTIONAL: Well-developed, well-nourished female in no acute distress.  EYES: EOM intact ENT: Normocephalic CARDIOVASCULAR: Regular rate MUSCULOSKELETAL: Normal ROM SKIN: Warm, dry without erythema  NEUROLOGICAL: Alert, oriented, CN II-XII grossly intact, Appropriate balance PSYCH: Normal behavior, mood   Assessment & Plan:  Assessment: 1. Migraine without aura and without status migrainosus, not intractable   2. Muscle  spasm   3. Stress at work   4. Sick headache      Plan: Take blood pressure regularly to be certain I si not elevated.  This am, she had a difficult drive to get here and notes she takes her BP routinely without elevations.   Imitrex for acute migraine IF no issues with BP.  Dr. Leonie Man in agreement per patient.  Add NSAID to ibuprofen for efficacy   Pt considering Botox for prevention.  I expect she would see improvement but she is uncertain if she can handle the injections.   Follow-up in 12 months or sooner PRN  Paticia Stack, PA-C 05/13/2017 9:36 AM

## 2017-05-16 ENCOUNTER — Ambulatory Visit (INDEPENDENT_AMBULATORY_CARE_PROVIDER_SITE_OTHER): Payer: Commercial Managed Care - PPO | Admitting: *Deleted

## 2017-05-16 DIAGNOSIS — I639 Cerebral infarction, unspecified: Secondary | ICD-10-CM | POA: Diagnosis not present

## 2017-05-16 NOTE — Progress Notes (Signed)
[facility] Summary Report / Loop Recorder 

## 2017-05-20 LAB — CUP PACEART REMOTE DEVICE CHECK
Implantable Pulse Generator Implant Date: 20170405
MDC IDC SESS DTM: 20181028224047

## 2017-06-01 ENCOUNTER — Other Ambulatory Visit: Payer: Self-pay | Admitting: Cardiovascular Disease

## 2017-06-14 ENCOUNTER — Ambulatory Visit (INDEPENDENT_AMBULATORY_CARE_PROVIDER_SITE_OTHER): Payer: Commercial Managed Care - PPO | Admitting: *Deleted

## 2017-06-14 DIAGNOSIS — I639 Cerebral infarction, unspecified: Secondary | ICD-10-CM

## 2017-06-15 NOTE — Progress Notes (Signed)
[facility] Summary Report / Loop Recorder 

## 2017-06-30 LAB — CUP PACEART REMOTE DEVICE CHECK
Date Time Interrogation Session: 20181128012215
MDC IDC PG IMPLANT DT: 20170405

## 2017-07-05 ENCOUNTER — Encounter: Payer: Self-pay | Admitting: Neurology

## 2017-07-05 ENCOUNTER — Ambulatory Visit: Payer: Commercial Managed Care - PPO | Admitting: Neurology

## 2017-07-05 VITALS — BP 147/86 | HR 57 | Wt 190.2 lb

## 2017-07-05 DIAGNOSIS — I639 Cerebral infarction, unspecified: Secondary | ICD-10-CM

## 2017-07-05 NOTE — Progress Notes (Signed)
[facility] [address] [place]. [place] [address] [phone]       OFFICE FOLLOW-UP NOTE  Raven [name] [name] Date of Birth:  03/10/1956 Medical Record Number:  [id number]   HPI:  Visit 12/24/2015 : Ms Raven [name] is a 45 year Caucasian lady seen today for first office follow-up visit following hospital admission for stroke in April 2017.[name] [name] is an 61 y.o. female patient who was brought into the emergency room on 10/20/2015 for further evaluation of speech problems she started having that morning. They've been occurring intermittently where she would slur and could not get certain words out properly. This type of slurring of the speech lasted couple of minutes at a time and happen multiple times throughout this morning. She did not have any trouble understanding her husband. Intermittent in between the episodes of slurred speech, she had fluent speech and able to speak full sentences without problem. Denies any other neurological symptoms in addition to this, no numbness in her upper or lower extremities or on the face. She did have some subjective paresthesias in the chin bilaterally but when she touches the fingers she did not have any numbness. No vision problems no motor weakness or gait difficulty or no vertigo.She never had any strokes before.She has very frequent migraines occurring 3-4 times a week and she has been in a clinical trial for migraine prophylactic therapy using monoclonal antibodies for the past 11 months. This is once a month injection of monoclonal antibodies. She has not noticed a significant improvement in her migraine frequency while she was on the trial. Otherwise she does not take any other prophylactic medication for migraine. She is on a couple of blood pressure pills. She takes Imitrex as needed. She hasn't taken Imitrex Imitrex this morning. She denies any headache or migraine symptoms this morning prior to onset of her sleep symptoms.  She denies any headache at this time.  Date last known well: 2017, April 3rd Time last known well: At 9 AM tPA Given: No: Nonfocal neurological exam. MRI scan of the brain showed a small punctate left frontal cortical infarct. MRA of the brain and carotid ultrasound showed no significant large vessel stenosis or occlusion. MRA of the brain showed no large vessel stenosis or occlusion. Carotid ultrasound was unremarkable. Transthoracic echo showed normal ejection fraction. Patient hadn't known history of PFO but lower extremity venous Dopplers were negative for DVT. LDL cholesterol was 90 mg percent and hemoglobin A1c was 5.1. She has started on aspirin and Lipitor and states she is doing well. She has had no bleeding or bruising. She is tolerating Lipitor without muscle aches and pains. Blood pressure is well controlled and today it is 132/83. She had TEE which showed no obvious cardiac source of embolism except PFO. Loop recorder has been inserted and so for paroxysmal atrial fibrillation have not yet been found. Patient admits to significant stress in her job and reports almost daily flushing of the cheeks as well as sweating over the shoulders which may be perhaps stress related. She has finished part sparing in a migraine recently but is yet in the follow-up phase. She has bought a elliptical and started exercising but has not lost any weight. She has been full neurological recovery and has no residual symptoms of deficits. Update 06/24/2016 : She returns for follow-up after last visit 6 months ago. She continues to do well without recurrent stroke or TIA symptoms. She's been full neurological recovery from her stroke in April  this year. She is starting aspirin well without bleeding or bruising or stomach upset. She states her blood pressure is well controlled and today it is 133/88 in our office. She is tolerating Lipitor well without side effects and had follow-up lipid profile checked 6 weeks ago by  her medical doctor and was satisfactory. She sustained an ankle sprain in August and has not been physically very active and in fact has gained about 5 pounds. She however is careful about her diet and she plans to exercise soon again. She has no new neurological complaints. Update 07/05/2017 : She returns for follow-up after last visit a year ago.  She continues to do well and has not had any recurrent TIA or stroke symptoms.  She made full neurological recovery from a stroke in April 2017.  She is tolerating aspirin well with only minor bruising and no bleeding.  She states her blood pressure at home is well controlled though today it is borderline in office at 147/86.  She is tolerating Lipitor well without muscle aches and pains.  She does have an upcoming annual physical exam and visit with primary physician and plans to have cholesterol checked again.  She has been careful about her diet but has not yet lost significant weight.  She does exercise 2-3 times a week and plans to do more soon.  She has no new complaints today. ROS:   14 system review of systems is positive for occasional headache and bruising only and all other systems negative PMH:  Past Medical History:  Diagnosis Date  . Abnormal Pap smear 08/2005  . Cancer (HCC)    cervical  . Diverticulosis   . History of migraines   . Hypertension   . Kidney stones 2012  . Menopause   . Migraine   . PFO (patent foramen ovale)   . Seasonal allergies   . Stroke [facility])     Social History:  Social History   Socioeconomic History  . Marital status: Married    Spouse name: Not on file  . Number of children: Not on file  . Years of education: Not on file  . Highest education level: Not on file  Social Needs  . Financial resource strain: Not on file  . Food insecurity - worry: Not on file  . Food insecurity - inability: Not on file  . Transportation needs - medical: Not on file  . Transportation needs - non-medical: Not on file    Occupational History  . Not on file  Tobacco Use  . Smoking status: Never Smoker  . Smokeless tobacco: Never Used  Substance and Sexual Activity  . Alcohol use: Yes    Alcohol/week: 0.6 oz    Types: 1 Glasses of wine per week    Comment: rare / wine  . Drug use: No  . Sexual activity: Not on file  Other Topics Concern  . Not on file  Social History Narrative  . Not on file    Medications:   Current Outpatient Medications on File Prior to Visit  Medication Sig Dispense Refill  . amLODipine (NORVASC) 2.5 MG tablet TAKE 1 TABLET (2.5 MG TOTAL) BY MOUTH DAILY. 90 tablet 0  . Ascorbic Acid (VITAMIN C) 1000 MG tablet Take 1,000 mg by mouth every evening.    Marland Kitchen aspirin 325 MG EC tablet Take 1 tablet (325 mg total) by mouth daily. 30 tablet 1  . atorvastatin (LIPITOR) 10 MG tablet TAKE 1 TABLET BY MOUTH DAILY AT 6  PM 90 tablet 2  . BYSTOLIC 10 MG tablet TAKE 1 TABLET BY MOUTH EVERY DAY 90 tablet 3  . Calcium Carbonate-Vitamin D (CALCIUM + D PO) Take 1 tablet by mouth every evening.     Marland Kitchen esomeprazole (NEXIUM) 20 MG capsule Take 20 mg by mouth daily at 12 noon.    . estrogens, conjugated, (PREMARIN) 0.625 MG tablet Take 0.625 mg by mouth daily. Take daily for 21 days then do not take for 7 days.    Marland Kitchen ketorolac (ACULAR) 0.5 % ophthalmic solution 1 DROP IN LEFT EYE FOUR TIMES A DAY START 72 HOURS PRIOR TO SURGERY  1  . Multiple Vitamin (MULTIVITAMIN PO) Take by mouth.      . nebivolol (BYSTOLIC) 10 MG tablet     . Probiotic Product (PROBIOTIC FORMULA) CAPS Take 1 capsule by mouth every evening.    . promethazine (PHENERGAN) 25 MG suppository Place 1 suppository (25 mg total) rectally every 6 (six) hours as needed for nausea. 12 each 3  . SUMAtriptan (IMITREX) 100 MG tablet Take 1 tablet (100 mg total) by mouth every 2 (two) hours as needed for migraine. May repeat in 2 hours if headache persists or recurs. 18 tablet 11   No current facility-administered medications on file prior to visit.      Allergies:   Allergies  Allergen Reactions  . Morphine And Related Nausea Only and Other (See Comments)    "not well tolerated"  . Codeine Nausea Only, Other (See Comments) and Nausea And Vomiting    "not well tolerated"  . Sulfa Antibiotics Itching, Hives and Rash    Physical Exam General: well developed, well nourished middle-age Caucasian lady, seated, in no evident distress Head: head normocephalic and atraumatic.  Neck: supple with no carotid or supraclavicular bruits Cardiovascular: regular rate and rhythm, no murmurs Musculoskeletal: no deformity Skin:  no rash/petichiae Vascular:  Normal pulses all extremities Vitals:   07/05/17 0834  BP: (!) 147/86  Pulse: (!) 57   Neurologic Exam Mental Status: Awake and fully alert. Oriented to place and time. Recent and remote memory intact. Attention span, concentration and fund of knowledge appropriate. Mood and affect appropriate.  Cranial Nerves: Fundoscopic exam not done.Pupils equal, briskly reactive to light. Extraocular movements full without nystagmus. Visual fields full to confrontation. Hearing intact. Facial sensation intact. Face, tongue, palate moves normally and symmetrically.  Motor: Normal bulk and tone. Normal strength in all tested extremity muscles. Sensory.: intact to touch ,pinprick .position and vibratory sensation.  Coordination: Rapid alternating movements normal in all extremities. Finger-to-nose and heel-to-shin performed accurately bilaterally. Gait and Station: Arises from chair without difficulty. Stance is normal. Gait demonstrates normal stride length and balance . Able to heel, toe and tandem walk without difficulty.  Reflexes: 1+ and symmetric. Toes downgoing.   NIHSS  0 Modified Rankin  0  ASSESSMENT: 65 year Caucasian lady with embolic left frontal MCA branch infarct in April 2017 of cryptogenic etiology. Vascular risk factors of hyperlipidemia , PFO and hypertension.    PLAN: I had a long  d/w patient about her remote cryptogenic stroke, risk for recurrent stroke/TIAs, personally independently reviewed imaging studies and stroke evaluation results and answered questions.Continue aspirin 325 mg daily  for secondary stroke prevention and maintain strict control of hypertension with blood pressure goal below 130/90, diabetes with hemoglobin A1c goal below 6.5% and lipids with LDL cholesterol goal below 70 mg/dL. I also advised the patient to eat a healthy diet with plenty of whole grains,  cereals, fruits and vegetables, exercise regularly and maintain ideal body weight .  Continue conservative follow-up for a patent foramen ovale. Check screening follow-up carotid ultrasound study.  No routine scheduled follow-up appointment is necessary.  Patient may be referred back by primary physician in the future as needed.Greater than 50% of time during this 30 minute visit was spent on counseling,explanation of diagnosis of cryptogenic stroke, planning of further management, discussion with patient ly and coordination of care  Antony Contras, MD Medical Director [facility] Pager: [phone] 07/05/2017 9:32 AM  Note: This document was prepared with digital dictation and possible smart phrase technology. Any transcriptional errors that result from this process are unintentional

## 2017-07-05 NOTE — Patient Instructions (Signed)
I had a long d/w patient about her remote cryptogenic stroke, risk for recurrent stroke/TIAs, personally independently reviewed imaging studies and stroke evaluation results and answered questions.Continue aspirin 325 mg daily  for secondary stroke prevention and maintain strict control of hypertension with blood pressure goal below 130/90, diabetes with hemoglobin A1c goal below 6.5% and lipids with LDL cholesterol goal below 70 mg/dL. I also advised the patient to eat a healthy diet with plenty of whole grains, cereals, fruits and vegetables, exercise regularly and maintain ideal body weight .  Check screening follow-up carotid ultrasound study.  No routine scheduled follow-up appointment is necessary.  Patient may be referred back by primary physician in the future as needed.

## 2017-07-14 ENCOUNTER — Ambulatory Visit (INDEPENDENT_AMBULATORY_CARE_PROVIDER_SITE_OTHER): Payer: Commercial Managed Care - PPO | Admitting: *Deleted

## 2017-07-14 DIAGNOSIS — I639 Cerebral infarction, unspecified: Secondary | ICD-10-CM | POA: Diagnosis not present

## 2017-07-15 NOTE — Progress Notes (Signed)
[facility] Summary Report / Loop Recorder 

## 2017-07-25 LAB — CUP PACEART REMOTE DEVICE CHECK
MDC IDC PG IMPLANT DT: 20170405
MDC IDC SESS DTM: 20181227233507

## 2017-07-26 ENCOUNTER — Other Ambulatory Visit: Payer: Self-pay | Admitting: Neurology

## 2017-07-26 ENCOUNTER — Other Ambulatory Visit: Payer: Self-pay | Admitting: Cardiovascular Disease

## 2017-07-28 ENCOUNTER — Telehealth: Payer: Self-pay | Admitting: Cardiovascular Disease

## 2017-07-28 MED ORDER — AMLODIPINE BESYLATE 2.5 MG PO TABS: 2.5000 mg | ORAL_TABLET | Freq: Every day | ORAL | 0 refills | Status: DC

## 2017-07-28 NOTE — Addendum Note (Signed)
Addended by: Leanord Asal T on: 07/28/2017 11:49 AM   Modules accepted: Orders

## 2017-07-28 NOTE — Telephone Encounter (Signed)
New Message   *STAT* If patient is at the pharmacy, call can be transferred to refill team.   1. Which medications need to be refilled? (please list name of each medication and dose if known) amLODipine 2.5mg  2. Which pharmacy/location (including street and city if local pharmacy) is medication to be sent to? [facility] [address] 3. Do they need a 30 day or 90 day supply? 90 day supply

## 2017-08-15 ENCOUNTER — Ambulatory Visit (INDEPENDENT_AMBULATORY_CARE_PROVIDER_SITE_OTHER): Payer: Commercial Managed Care - PPO | Admitting: *Deleted

## 2017-08-15 DIAGNOSIS — I639 Cerebral infarction, unspecified: Secondary | ICD-10-CM | POA: Diagnosis not present

## 2017-08-15 NOTE — Progress Notes (Signed)
[facility] Summary Report / Loop Recorder 

## 2017-08-23 ENCOUNTER — Telehealth: Payer: Self-pay | Admitting: Neurology

## 2017-08-23 ENCOUNTER — Ambulatory Visit (HOSPITAL_COMMUNITY)
Admission: RE | Admit: 2017-08-23 | Discharge: 2017-08-23 | Disposition: A | Discharge status: Home / Self Care | Payer: Commercial Managed Care - PPO | Source: Ambulatory Visit | Attending: Neurology | Admitting: Neurology

## 2017-08-23 ENCOUNTER — Telehealth: Payer: Self-pay

## 2017-08-23 DIAGNOSIS — I6523 Occlusion and stenosis of bilateral carotid arteries: Secondary | ICD-10-CM | POA: Diagnosis not present

## 2017-08-23 DIAGNOSIS — I639 Cerebral infarction, unspecified: Secondary | ICD-10-CM | POA: Diagnosis present

## 2017-08-23 NOTE — Telephone Encounter (Signed)
Pt returning RN's call.

## 2017-08-23 NOTE — Telephone Encounter (Signed)
Notes recorded by Marval Regal, RN on 08/23/2017 at 2:49 PM EST Left vm for patient to call back about carotid doppler results. ------

## 2017-08-23 NOTE — Progress Notes (Signed)
Carotid duplex prelim: 1-39% ICA stenosis.  Hisako Bugh Eunice, RDMS, RVT   

## 2017-08-23 NOTE — Telephone Encounter (Signed)
-----   Message from Garvin Fila, MD sent at 08/23/2017 12:13 PM EST ----- Kindly let the patient know that carotid ultrasound study was unremarkable.

## 2017-08-23 NOTE — Telephone Encounter (Signed)
Notes recorded by Marval Regal, RN on 08/23/2017 at 5:31 PM EST Rn call patient that the carotid ultrasound study was unremarkable. Pt verbalized understanding.

## 2017-08-25 LAB — CUP PACEART REMOTE DEVICE CHECK
Date Time Interrogation Session: 20190127003605
MDC IDC PG IMPLANT DT: 20170405

## 2017-09-15 ENCOUNTER — Ambulatory Visit (INDEPENDENT_AMBULATORY_CARE_PROVIDER_SITE_OTHER): Payer: Commercial Managed Care - PPO | Admitting: *Deleted

## 2017-09-15 DIAGNOSIS — I639 Cerebral infarction, unspecified: Secondary | ICD-10-CM

## 2017-09-16 NOTE — Progress Notes (Signed)
[facility] Summary Report / Loop Recorder 

## 2017-09-26 ENCOUNTER — Other Ambulatory Visit: Payer: Self-pay | Admitting: Obstetrics & Gynecology

## 2017-09-26 DIAGNOSIS — Z1231 Encounter for screening mammogram for malignant neoplasm of breast: Secondary | ICD-10-CM

## 2017-10-18 ENCOUNTER — Ambulatory Visit (INDEPENDENT_AMBULATORY_CARE_PROVIDER_SITE_OTHER): Payer: Commercial Managed Care - PPO | Admitting: *Deleted

## 2017-10-18 DIAGNOSIS — I639 Cerebral infarction, unspecified: Secondary | ICD-10-CM | POA: Diagnosis not present

## 2017-10-19 NOTE — Progress Notes (Signed)
[facility] Summary Report / Loop Recorder 

## 2017-10-24 LAB — CUP PACEART REMOTE DEVICE CHECK
Date Time Interrogation Session: 20190301003634
MDC IDC PG IMPLANT DT: 20170405

## 2017-10-31 ENCOUNTER — Ambulatory Visit: Payer: Commercial Managed Care - PPO | Admitting: Cardiovascular Disease

## 2017-10-31 ENCOUNTER — Encounter: Payer: Self-pay | Admitting: Cardiovascular Disease

## 2017-10-31 VITALS — BP 122/76 | HR 59 | Ht 69.0 in | Wt 189.0 lb

## 2017-10-31 DIAGNOSIS — Z8673 Personal history of transient ischemic attack (TIA), and cerebral infarction without residual deficits: Secondary | ICD-10-CM | POA: Diagnosis not present

## 2017-10-31 DIAGNOSIS — E785 Hyperlipidemia, unspecified: Secondary | ICD-10-CM

## 2017-10-31 DIAGNOSIS — Z79899 Other long term (current) drug therapy: Secondary | ICD-10-CM

## 2017-10-31 DIAGNOSIS — Z9889 Other specified postprocedural states: Secondary | ICD-10-CM | POA: Diagnosis not present

## 2017-10-31 DIAGNOSIS — I10 Essential (primary) hypertension: Secondary | ICD-10-CM

## 2017-10-31 LAB — COMPREHENSIVE METABOLIC PANEL WITH GFR
ALT: 16 [IU]/L (ref 0–32)
AST: 18 [IU]/L (ref 0–40)
Albumin/Globulin Ratio: 1.8 (ref 1.2–2.2)
Albumin: 4.3 g/dL (ref 3.6–4.8)
Alkaline Phosphatase: 99 [IU]/L (ref 39–117)
BUN/Creatinine Ratio: 12 (ref 12–28)
BUN: 13 mg/dL (ref 8–27)
Bilirubin Total: 0.6 mg/dL (ref 0.0–1.2)
CO2: 23 mmol/L (ref 20–29)
Calcium: 9.4 mg/dL (ref 8.7–10.3)
Chloride: 103 mmol/L (ref 96–106)
Creatinine, Ser: 1.13 mg/dL — ABNORMAL HIGH (ref 0.57–1.00)
GFR calc Af Amer: 61 mL/min/{1.73_m2}
GFR calc non Af Amer: 53 mL/min/{1.73_m2} — ABNORMAL LOW
Globulin, Total: 2.4 g/dL (ref 1.5–4.5)
Glucose: 76 mg/dL (ref 65–99)
Potassium: 3.8 mmol/L (ref 3.5–5.2)
Sodium: 141 mmol/L (ref 134–144)
Total Protein: 6.7 g/dL (ref 6.0–8.5)

## 2017-10-31 LAB — LIPID PANEL
Chol/HDL Ratio: 2.7 ratio (ref 0.0–4.4)
Cholesterol, Total: 142 mg/dL (ref 100–199)
HDL: 52 mg/dL (ref >39)
LDL Calculated: 68 mg/dL (ref 0–99)
TRIGLYCERIDES: 108 mg/dL (ref 0–149)
VLDL Cholesterol Cal: 22 mg/dL (ref 5–40)

## 2017-10-31 MED ORDER — NEBIVOLOL HCL 10 MG PO TABS: 10.0000 mg | ORAL_TABLET | Freq: Every day | ORAL | 3 refills | Status: DC

## 2017-10-31 MED ORDER — AMLODIPINE BESYLATE 2.5 MG PO TABS: 2.5000 mg | ORAL_TABLET | Freq: Every day | ORAL | 3 refills | Status: DC

## 2017-10-31 MED ORDER — ATORVASTATIN CALCIUM 10 MG PO TABS: 10.0000 mg | ORAL_TABLET | Freq: Every day | ORAL | 3 refills | Status: DC

## 2017-10-31 NOTE — Progress Notes (Signed)
Patient ID: Raven [name], female   DOB: 1955/10/30, 62 y.o.   MRN: [id number]     Cardiology Office Note    Date:  11/02/2017   ID:  AYBREE LANYON, DOB [birth year], MRN [id number]  PCP:  Thressa Sheller, MD  Cardiologist:   Sanda Klein, MD   Chief Complaint  Patient presents with  . Follow-up    12 months    History of Present Illness:  Raven [name] is a 62 y.o. female with hyperlipidemia, systemic hypertension and cryptogenic stroke that occurred roughly 2 years ago, returning for routine follow-up.  Her stroke presented with speech impairment in April  2017 and has resolved without any sequelae. Brain MRI showed "several small acute posterior left MCA territory cortical and white matter infarcts", CT angiogram of the head and neck did not show significant stenoses, TEE did not show a cardioembolic source of stroke. She has an implantable loop recorder that has not shown any atrial fibrillation to date.  She has a lifelong history of migraine headaches but the frequency has drastically diminished after she reduce her dose of estrogen supplement.  The patient specifically denies any chest pain at rest exertion, dyspnea at rest or with exertion, orthopnea, paroxysmal nocturnal dyspnea, syncope, palpitations, focal neurological deficits, intermittent claudication, lower extremity edema, unexplained weight gain, cough, hemoptysis or wheezing.   Past Medical History:  Diagnosis Date  . Abnormal Pap smear 08/2005  . Cancer (HCC)    cervical  . Diverticulosis   . History of migraines   . Hypertension   . Kidney stones 2012  . Menopause   . Migraine   . PFO (patent foramen ovale)   . Seasonal allergies   . Stroke [facility])     Past Surgical History:  Procedure Laterality Date  . BUNIONECTOMY  1987  . EP IMPLANTABLE DEVICE N/A 10/22/2015   Procedure: Loop Recorder Insertion;  Surgeon: Will Meredith Leeds, MD;  Location: [place] [facility];  Service: Cardiovascular;   Laterality: N/A;  . NM MYOCAR PERF WALL MOTION  02/28/2008   normal  . partial mastectomy  2008  . TEE WITHOUT CARDIOVERSION N/A 10/22/2015   Procedure: TRANSESOPHAGEAL ECHOCARDIOGRAM (TEE);  Surgeon: Skeet Latch, MD;  Location: [facility] ENDOSCOPY;  Service: Cardiovascular;  Laterality: N/A;  . TOTAL ABDOMINAL HYSTERECTOMY     cervical cancer  . TUBAL LIGATION  1988  . US ECHOCARDIOGRAPHY  03/21/2008   Trace MR,TR & PR, positive saline contrast bubble study    Outpatient Medications Prior to Visit  Medication Sig Dispense Refill  . Ascorbic Acid (VITAMIN C) 1000 MG tablet Take 1,000 mg by mouth every evening.    Marland Kitchen aspirin 325 MG EC tablet Take 1 tablet (325 mg total) by mouth daily. 30 tablet 1  . Calcium Carbonate-Vitamin D (CALCIUM + D PO) Take 1 tablet by mouth every evening.     Marland Kitchen esomeprazole (NEXIUM) 20 MG capsule Take 20 mg by mouth daily at 12 noon.    . estrogens, conjugated, (PREMARIN) 0.625 MG tablet Take 0.625 mg by mouth daily. Take daily for 21 days then do not take for 7 days.    Marland Kitchen ketorolac (ACULAR) 0.5 % ophthalmic solution 1 DROP IN LEFT EYE FOUR TIMES A DAY START 72 HOURS PRIOR TO SURGERY  1  . Multiple Vitamin (MULTIVITAMIN PO) Take by mouth.      . promethazine (PHENERGAN) 25 MG suppository Place 1 suppository (25 mg total) rectally every 6 (six) hours as needed for nausea. (Patient  taking differently: Place 31 mg rectally every 6 (six) hours as needed for nausea. ) 12 each 3  . SUMAtriptan (IMITREX) 100 MG tablet Take 1 tablet (100 mg total) by mouth every 2 (two) hours as needed for migraine. May repeat in 2 hours if headache persists or recurs. 18 tablet 11  . amLODipine (NORVASC) 2.5 MG tablet Take 1 tablet (2.5 mg total) by mouth daily. 90 tablet 0  . atorvastatin (LIPITOR) 10 MG tablet TAKE 1 TABLET BY MOUTH DAILY AT 6 PM 90 tablet 3  . BYSTOLIC 10 MG tablet TAKE 1 TABLET BY MOUTH EVERY DAY 90 tablet 3  . Probiotic Product (PROBIOTIC FORMULA) CAPS Take 1 capsule by  mouth every evening.     No facility-administered medications prior to visit.      Allergies:   Morphine and related; Codeine; and Sulfa antibiotics   Social History   Socioeconomic History  . Marital status: Married    Spouse name: Not on file  . Number of children: Not on file  . Years of education: Not on file  . Highest education level: Not on file  Occupational History  . Not on file  Social Needs  . Financial resource strain: Not on file  . Food insecurity:    Worry: Not on file    Inability: Not on file  . Transportation needs:    Medical: Not on file    Non-medical: Not on file  Tobacco Use  . Smoking status: Never Smoker  . Smokeless tobacco: Never Used  Substance and Sexual Activity  . Alcohol use: Yes    Alcohol/week: 0.6 oz    Types: 1 Glasses of wine per week    Comment: rare / wine  . Drug use: No  . Sexual activity: Not on file  Lifestyle  . Physical activity:    Days per week: Not on file    Minutes per session: Not on file  . Stress: Not on file  Relationships  . Social connections:    Talks on phone: Not on file    Gets together: Not on file    Attends religious service: Not on file    Active member of club or organization: Not on file    Attends meetings of clubs or organizations: Not on file    Relationship status: Not on file  Other Topics Concern  . Not on file  Social History Narrative  . Not on file     Family History:  The patient's family history includes Heart disease in her father and mother; Hypertension in her father.   ROS:   Please see the history of present illness.    ROS All other systems reviewed and are negative.   PHYSICAL EXAM:   VS:  BP 122/76 (BP Location: Left Arm, Patient Position: Sitting, Cuff Size: Normal)   Pulse (!) 59   Ht 5\' 9"  (1.753 m)   Wt 189 lb (85.7 kg)   BMI 27.91 kg/m     General: Alert, oriented x3, no distress, mildly overweight.  Loop recorder site looks healthy Head: no evidence of  trauma, PERRL, EOMI, no exophtalmos or lid lag, no myxedema, no xanthelasma; normal ears, nose and oropharynx Neck: normal jugular venous pulsations and no hepatojugular reflux; brisk carotid pulses without delay and no carotid bruits Chest: clear to auscultation, no signs of consolidation by percussion or palpation, normal fremitus, symmetrical and full respiratory excursions Cardiovascular: normal position and quality of the apical impulse, regular rhythm, normal first  and second heart sounds, no murmurs, rubs or gallops Abdomen: no tenderness or distention, no masses by palpation, no abnormal pulsatility or arterial bruits, normal bowel sounds, no hepatosplenomegaly Extremities: no clubbing, cyanosis or edema; 2+ radial, ulnar and brachial pulses bilaterally; 2+ right femoral, posterior tibial and dorsalis pedis pulses; 2+ left femoral, posterior tibial and dorsalis pedis pulses; no subclavian or femoral bruits Neurological: grossly nonfocal Psych: Normal mood and affect   Wt Readings from Last 3 Encounters:  10/31/17 189 lb (85.7 kg)  07/05/17 190 lb 3.2 oz (86.3 kg)  05/13/17 187 lb 6.4 oz (85 kg)      Studies/Labs Reviewed:   EKG:  EKG is ordered today.  The ekg ordered today demonstrates mild sinus bradycardia, QTC 399 ms Lipid Panel     Component Value Date/Time   CHOL 142 10/31/2017 0937   TRIG 108 10/31/2017 0937   HDL 52 10/31/2017 0937   CHOLHDL 2.7 10/31/2017 0937   CHOLHDL 2.5 05/10/2016 0802   VLDL 22 05/10/2016 0802   LDLCALC 68 10/31/2017 0937     ASSESSMENT:    1. Essential hypertension   2. Dyslipidemia   3. History of embolic stroke   4. History of loop recorder   5. Medication management      PLAN:  In order of problems listed above:  1. HTN: Excellent control.  Avoid higher dose blocker due to bradycardia. Diuretics caused cramps in the past.  2. HLP: Lipid panel checked today shows excellent parameters 3. Hx of L MCA ischemic CVA: No sequelae,  remains unexplained. 4. ILR: No arrhythmia to date. Monitored by Dr. Curt Bears  Medication Adjustments/Labs and Tests Ordered: Current medicines are reviewed at length with the patient today.  Concerns regarding medicines are outlined above.  Medication changes, Labs and Tests ordered today are listed in the Patient Instructions below. Patient Instructions  Dr Sallyanne Kuster recommends that you continue on your current medications as directed. Please refer to the Current Medication list given to you today.  Your physician recommends that you return for lab work at your earliest [place].  Dr Sallyanne Kuster recommends that you schedule a follow-up appointment in 12 months. You will receive a reminder letter in the mail two months in advance. If you don't receive a letter, please call our office to schedule the follow-up appointment.  If you need a refill on your cardiac medications before your next appointment, please call your pharmacy.      Signed, Sanda Klein, MD  11/02/2017 6:14 PM    [facility] [address], [place], [place]  [address] Phone: [phone]; Fax: [phone]

## 2017-10-31 NOTE — Patient Instructions (Signed)
Dr Croitoru recommends that you continue on your current medications as directed. Please refer to the Current Medication list given to you today.  Your physician recommends that you return for lab work at your earliest convenience - FASTING.  Dr Croitoru recommends that you schedule a follow-up appointment in 12 months. You will receive a reminder letter in the mail two months in advance. If you don't receive a letter, please call our office to schedule the follow-up appointment.  If you need a refill on your cardiac medications before your next appointment, please call your pharmacy. 

## 2017-11-02 ENCOUNTER — Encounter: Payer: Self-pay | Admitting: Cardiovascular Disease

## 2017-11-21 ENCOUNTER — Ambulatory Visit (INDEPENDENT_AMBULATORY_CARE_PROVIDER_SITE_OTHER): Payer: Commercial Managed Care - PPO | Admitting: *Deleted

## 2017-11-21 DIAGNOSIS — I639 Cerebral infarction, unspecified: Secondary | ICD-10-CM

## 2017-11-21 NOTE — Progress Notes (Signed)
[facility] Summary Report / Loop Recorder 

## 2017-11-23 LAB — CUP PACEART REMOTE DEVICE CHECK
Implantable Pulse Generator Implant Date: 20170405
MDC IDC SESS DTM: 20190403014032

## 2017-12-13 LAB — CUP PACEART REMOTE DEVICE CHECK
Implantable Pulse Generator Implant Date: 20170405
MDC IDC SESS DTM: 20190506020559

## 2017-12-23 ENCOUNTER — Ambulatory Visit (INDEPENDENT_AMBULATORY_CARE_PROVIDER_SITE_OTHER): Payer: Commercial Managed Care - PPO | Admitting: *Deleted

## 2017-12-23 DIAGNOSIS — I639 Cerebral infarction, unspecified: Secondary | ICD-10-CM | POA: Diagnosis not present

## 2017-12-26 NOTE — Progress Notes (Signed)
[facility] Summary Report / Loop Recorder 

## 2018-01-25 ENCOUNTER — Ambulatory Visit (INDEPENDENT_AMBULATORY_CARE_PROVIDER_SITE_OTHER): Payer: Commercial Managed Care - PPO | Admitting: *Deleted

## 2018-01-25 DIAGNOSIS — I639 Cerebral infarction, unspecified: Secondary | ICD-10-CM

## 2018-01-26 NOTE — Progress Notes (Signed)
[facility] Summary Report / Loop Recorder 

## 2018-01-30 LAB — CUP PACEART REMOTE DEVICE CHECK
Implantable Pulse Generator Implant Date: 20170405
MDC IDC SESS DTM: 20190608024106

## 2018-02-11 ENCOUNTER — Ambulatory Visit (HOSPITAL_COMMUNITY)
Admission: EM | Admit: 2018-02-11 | Discharge: 2018-02-11 | Disposition: A | Discharge status: Home / Self Care | Payer: Commercial Managed Care - PPO | Attending: Internal Medicine | Admitting: Internal Medicine

## 2018-02-11 ENCOUNTER — Encounter (HOSPITAL_COMMUNITY): Payer: Self-pay

## 2018-02-11 DIAGNOSIS — T7840XA Allergy, unspecified, initial encounter: Secondary | ICD-10-CM

## 2018-02-11 DIAGNOSIS — L509 Urticaria, unspecified: Secondary | ICD-10-CM

## 2018-02-11 DIAGNOSIS — L500 Allergic urticaria: Secondary | ICD-10-CM

## 2018-02-11 MED ORDER — DIPHENHYDRAMINE HCL 50 MG/ML IJ SOLN
INTRAMUSCULAR | Status: AC
  Filled 2018-02-11: qty 1

## 2018-02-11 MED ORDER — PREDNISONE 10 MG (21) PO TBPK: ORAL_TABLET | Freq: Every day | ORAL | 0 refills | Status: DC

## 2018-02-11 MED ORDER — FAMOTIDINE 20 MG PO TABS
20.0000 mg | ORAL_TABLET | Freq: Once | ORAL | Status: AC
  Administered 2018-02-11: 20 mg via ORAL

## 2018-02-11 MED ORDER — DIPHENHYDRAMINE HCL 50 MG/ML IJ SOLN
50.0000 mg | Freq: Once | INTRAMUSCULAR | Status: AC
  Administered 2018-02-11: 50 mg via INTRAMUSCULAR

## 2018-02-11 MED ORDER — METHYLPREDNISOLONE SODIUM SUCC 125 MG IJ SOLR
125.0000 mg | Freq: Once | INTRAMUSCULAR | Status: AC
  Administered 2018-02-11: 125 mg via INTRAMUSCULAR

## 2018-02-11 MED ORDER — FAMOTIDINE 20 MG PO TABS
ORAL_TABLET | ORAL | Status: AC
  Filled 2018-02-11: qty 1

## 2018-02-11 MED ORDER — CEPHALEXIN 500 MG PO CAPS: 500.0000 mg | ORAL_CAPSULE | Freq: Four times a day (QID) | ORAL | 0 refills | Status: AC

## 2018-02-11 MED ORDER — METHYLPREDNISOLONE SODIUM SUCC 125 MG IJ SOLR
INTRAMUSCULAR | Status: AC
  Filled 2018-02-11: qty 2

## 2018-02-11 NOTE — ED Provider Notes (Signed)
[facility]    CSN: [id number] Arrival date & time: 02/11/18  1726     History   Chief Complaint Chief Complaint  Patient presents with  . Allergic Reaction    HPI Raven [name] is a 62 y.o. female history of hypertension, hyperlipidemia, previous cancer presenting today for evaluation of allergic reaction.  Patient states that last night she awoke and realized that she had broke out in hives all over her body.  She has had persistent rash and itching throughout the day.  She has had some mild tingling around her mouth, but denies any throat swelling, tongue swelling, shortness of breath or difficulty breathing.  She notes that last night she ate shrimp for dinner, but she has eaten shrimp many many times in the past.  She also notes that she has been taking Macrobid for UTI, but also notes that she has previously tolerated this antibiotic as well.  No other known new exposures.  She has tried Benadryl, last Benadryl was around 2 PM this afternoon.  HPI  Past Medical History:  Diagnosis Date  . Abnormal Pap smear 08/2005  . Cancer (HCC)    cervical  . Diverticulosis   . History of migraines   . Hypertension   . Kidney stones 2012  . Menopause   . Migraine   . PFO (patent foramen ovale)   . Seasonal allergies   . Stroke [facility])     Patient Active Problem List   Diagnosis Date Noted  . Hyperlipidemia 05/10/2016  . History of ischemic left MCA stroke 05/10/2016  . Status post placement of implantable loop recorder 05/10/2016  . Stress at work 12/30/2015  . Acute CVA (cerebrovascular accident) ([place]) 10/20/2015  . HTN (hypertension) 11/21/2013  . Nephrolithiasis 11/21/2013  . Breast cancer ([place]) 10/02/2013  . Migraine 05/11/2011  . Cervical cancer ([place]) 05/11/2011    Past Surgical History:  Procedure Laterality Date  . BUNIONECTOMY  1987  . EP IMPLANTABLE DEVICE N/A 10/22/2015   Procedure: Loop Recorder Insertion;  Surgeon: Will Meredith Leeds, MD;   Location: [place] CV LAB;  Service: Cardiovascular;  Laterality: N/A;  . NM MYOCAR PERF WALL MOTION  02/28/2008   normal  . partial mastectomy  2008  . TEE WITHOUT CARDIOVERSION N/A 10/22/2015   Procedure: TRANSESOPHAGEAL ECHOCARDIOGRAM (TEE);  Surgeon: Skeet Latch, MD;  Location: [facility] ENDOSCOPY;  Service: Cardiovascular;  Laterality: N/A;  . TOTAL ABDOMINAL HYSTERECTOMY     cervical cancer  . TUBAL LIGATION  1988  . US ECHOCARDIOGRAPHY  03/21/2008   Trace MR,TR & PR, positive saline contrast bubble study    OB History    Gravida  3   Para  3   Term  3   Preterm      AB      Living  3     SAB      TAB      Ectopic      Multiple      Live Births               Home Medications    Prior to Admission medications   Medication Sig Start Date End Date Taking? Authorizing Provider  amLODipine (NORVASC) 2.5 MG tablet Take 1 tablet (2.5 mg total) by mouth daily. 10/31/17  Yes Croitoru, Mihai, MD  Ascorbic Acid (VITAMIN C) 1000 MG tablet Take 1,000 mg by mouth every evening.   Yes [provider]  aspirin 325 MG EC tablet Take 1 tablet (  325 mg total) by mouth daily. 11/14/15  Yes Garvin Fila, MD  atorvastatin (LIPITOR) 10 MG tablet Take 1 tablet (10 mg total) by mouth daily at 6 PM. 10/31/17  Yes Croitoru, Mihai, MD  Calcium Carbonate-Vitamin D (CALCIUM + D PO) Take 1 tablet by mouth every evening.    Yes [provider]  esomeprazole (NEXIUM) 20 MG capsule Take 20 mg by mouth daily at 12 noon.   Yes [provider]  estrogens, conjugated, (PREMARIN) 0.625 MG tablet Take 0.625 mg by mouth daily. Take daily for 21 days then do not take for 7 days.   Yes [provider]  Multiple Vitamin (MULTIVITAMIN PO) Take by mouth.     Yes [provider]  nebivolol (BYSTOLIC) 10 MG tablet Take 1 tablet (10 mg total) by mouth daily. 10/31/17  Yes Croitoru, Mihai, MD  promethazine (PHENERGAN) 25 MG suppository Place 1 suppository (25 mg  total) rectally every 6 (six) hours as needed for nausea. Patient taking differently: Place 31 mg rectally every 6 (six) hours as needed for nausea.  05/13/17 09/03/18 Yes Teague Bobbye Morton, PA-C  SUMAtriptan (IMITREX) 100 MG tablet Take 1 tablet (100 mg total) by mouth every 2 (two) hours as needed for migraine. May repeat in 2 hours if headache persists or recurs. 05/13/17  Yes Teague Bobbye Morton, PA-C  cephALEXin (KEFLEX) 500 MG capsule Take 1 capsule (500 mg total) by mouth 4 (four) times daily for 5 days. 02/11/18 02/16/18  Elray Dains C, PA-C  ketorolac (ACULAR) 0.5 % ophthalmic solution 1 DROP IN LEFT EYE FOUR TIMES A DAY START 72 HOURS PRIOR TO SURGERY 06/21/17   [provider]  predniSONE (STERAPRED UNI-PAK 21 TAB) 10 MG (21) TBPK tablet Take by mouth daily. Take as directed 02/11/18   Janith Lima, PA-C    Family History Family History  Problem Relation Age of Onset  . Heart disease Mother        heart attack  . Heart disease Father        heart attack  . Hypertension Father     Social History Social History   Tobacco Use  . Smoking status: Never Smoker  . Smokeless tobacco: Never Used  Substance Use Topics  . Alcohol use: Yes    Alcohol/week: 0.6 oz    Types: 1 Glasses of wine per week    Comment: rare / wine  . Drug use: No     Allergies   Morphine and related; Codeine; and Sulfa antibiotics   Review of Systems Review of Systems  Constitutional: Negative for fatigue and fever.  HENT: Negative for mouth sores.   Eyes: Negative for visual disturbance.  Respiratory: Negative for shortness of breath.   Cardiovascular: Negative for chest pain.  Gastrointestinal: Negative for abdominal pain, nausea and vomiting.  Musculoskeletal: Negative for arthralgias and joint swelling.  Skin: Positive for color change and rash. Negative for wound.  Neurological: Negative for dizziness, weakness, light-headedness and headaches.     Physical Exam Triage  Vital Signs ED Triage Vitals  Enc Vitals Group     BP 02/11/18 1803 131/87     Pulse Rate 02/11/18 1803 73     Resp 02/11/18 1803 16     Temp 02/11/18 1803 97.9 F (36.6 C)     Temp Source 02/11/18 1803 Oral     SpO2 02/11/18 1803 95 %     Weight --      Height --  Head Circumference --      Peak Flow --      Pain Score 02/11/18 1807 0     Pain Loc --      Pain Edu? --      Excl. in [place]? --    No data found.  Updated Vital Signs BP 131/87 (BP Location: Left Arm)   Pulse 73   Temp 97.9 F (36.6 C) (Oral)   Resp 16   SpO2 95%   Visual Acuity Right Eye Distance:   Left Eye Distance:   Bilateral Distance:    Right Eye Near:   Left Eye Near:    Bilateral Near:     Physical Exam  Constitutional: She appears well-developed and well-nourished. No distress.  HENT:  Head: Normocephalic and atraumatic.  Mouth/Throat: Oropharynx is clear and moist.  Oral mucosa pink and moist, no tonsillar enlargement or exudate. Posterior pharynx patent and nonerythematous, no uvula deviation or swelling. Normal phonation.  Eyes: Conjunctivae are normal.  Neck: Neck supple.  Cardiovascular: Normal rate and regular rhythm.  No murmur heard. Pulmonary/Chest: Effort normal and breath sounds normal. No respiratory distress.  Breathing comfortably at rest, CTABL, no wheezing, rales or other adventitious sounds auscultated  Abdominal: Soft. There is no tenderness.  Musculoskeletal: She exhibits no edema.  Neurological: She is alert.  Skin: Skin is warm and dry.  Erythematous maculopapular hive-like blanchable rash diffusely across upper extremities, trunk, lower extremities.  Psychiatric: She has a normal mood and affect.  Nursing note and vitals reviewed.    UC Treatments / Results  Labs (all labs ordered are listed, but only abnormal results are displayed) Labs Reviewed - No data to display  EKG None  Radiology No results found.  Procedures Procedures (including critical  care time)  Medications Ordered in UC Medications  methylPREDNISolone sodium succinate (SOLU-MEDROL) 125 mg/2 mL injection 125 mg (125 mg Intramuscular Given 02/11/18 1840)  famotidine (PEPCID) tablet 20 mg (20 mg Oral Given 02/11/18 1838)  diphenhydrAMINE (BENADRYL) injection 50 mg (50 mg Intramuscular Given 02/11/18 1842)    Initial Impression / Assessment and Plan / UC Course  I have reviewed the triage vital signs and the nursing notes.  Pertinent labs & imaging results that were available during my care of the patient were reviewed by me and considered in my medical decision making (see chart for details).     Patient with allergic type rash, unclear trigger.  Vital signs stable, airway patent.  He will Macrobid more likely than shrimp given frequency of eating shrimp.  Will provide Solu-Medrol 125, Pepcid and Benadryl.  Husband driving patient home.  Will stop Macrobid, will initiate Keflex for UTI symptoms.Discussed strict return precautions. Patient verbalized understanding and is agreeable with plan.  Final Clinical Impressions(s) / UC Diagnoses   Final diagnoses:  Allergic reaction, initial encounter  Hives     Discharge Instructions     Please begin prednisone taper tomorrow beginning with 6 tablets, and decreased by 1 tablet each day until completion.  Please have food on stomach when taking.  Please continue to use antihistamines like Zyrtec, Claritin or Benadryl to help with itching.  May use With famotidine/Pepcid for further itch relief.  Please return if developing difficulty breathing, shortness of breath, symptoms worsening or not improving in approximately 3 to 4 days.  Please stop Macrobid.  Please switch to Keflex taking every 6 hours for the next 5 days.  Please follow-up if UTI symptoms not improving.   ED Prescriptions  Medication Sig Dispense Auth. Provider   predniSONE (STERAPRED UNI-PAK 21 TAB) 10 MG (21) TBPK tablet Take by mouth daily. Take as  directed 21 tablet Kohl Polinsky C, PA-C   cephALEXin (KEFLEX) 500 MG capsule Take 1 capsule (500 mg total) by mouth 4 (four) times daily for 5 days. 20 capsule Evangeline Utley C, PA-C     Controlled Substance Prescriptions [place] Controlled Substance Registry consulted? Not Applicable   Janith Lima, [place] 02/11/18 2005

## 2018-02-11 NOTE — ED Triage Notes (Signed)
Pt presents with complaints of hives all over after taking Macrobid and eating shrimp last night. Unsure which it is from.

## 2018-02-11 NOTE — Discharge Instructions (Signed)
Please begin prednisone taper tomorrow beginning with 6 tablets, and decreased by 1 tablet each day until completion.  Please have food on stomach when taking.  Please continue to use antihistamines like Zyrtec, Claritin or Benadryl to help with itching.  May use With famotidine/Pepcid for further itch relief.  Please return if developing difficulty breathing, shortness of breath, symptoms worsening or not improving in approximately 3 to 4 days.  Please stop Macrobid.  Please switch to Keflex taking every 6 hours for the next 5 days.  Please follow-up if UTI symptoms not improving.

## 2018-02-28 ENCOUNTER — Ambulatory Visit (INDEPENDENT_AMBULATORY_CARE_PROVIDER_SITE_OTHER): Payer: Commercial Managed Care - PPO | Admitting: *Deleted

## 2018-02-28 DIAGNOSIS — I639 Cerebral infarction, unspecified: Secondary | ICD-10-CM

## 2018-02-28 NOTE — Progress Notes (Signed)
[facility] Summary Report / Loop Recorder 

## 2018-03-02 LAB — CUP PACEART REMOTE DEVICE CHECK
Date Time Interrogation Session: 20190711064005
Implantable Pulse Generator Implant Date: 20170405

## 2018-03-07 ENCOUNTER — Telehealth: Payer: Self-pay | Admitting: Cardiology

## 2018-03-07 NOTE — Telephone Encounter (Signed)
LMOVM requesting that pt send manual transmission b/c home monitor has not updated in at least 14 days.    

## 2018-03-13 ENCOUNTER — Telehealth: Payer: Self-pay

## 2018-03-13 NOTE — Telephone Encounter (Signed)
LMOVM requesting that pt send manual transmission b/c home monitor has not updated in at least 14 days.    

## 2018-03-22 ENCOUNTER — Encounter: Payer: Self-pay | Admitting: Cardiology

## 2018-03-27 ENCOUNTER — Telehealth: Payer: Self-pay

## 2018-03-27 NOTE — Telephone Encounter (Signed)
LMOVM requesting that pt send manual transmission b/c home monitor has not updated in at least 14 days.    

## 2018-03-28 ENCOUNTER — Other Ambulatory Visit: Payer: Self-pay | Admitting: Physician Assistant

## 2018-03-28 DIAGNOSIS — G43009 Migraine without aura, not intractable, without status migrainosus: Secondary | ICD-10-CM

## 2018-04-03 ENCOUNTER — Ambulatory Visit: Payer: Commercial Managed Care - PPO | Admitting: *Deleted

## 2018-04-03 NOTE — Progress Notes (Addendum)
No transmission  

## 2018-04-06 LAB — CUP PACEART REMOTE DEVICE CHECK
Implantable Pulse Generator Implant Date: 20170405
MDC IDC SESS DTM: 20190813104139

## 2018-05-05 ENCOUNTER — Encounter: Payer: Commercial Managed Care - PPO | Admitting: *Deleted

## 2018-05-12 ENCOUNTER — Other Ambulatory Visit: Payer: Self-pay | Admitting: Family Medicine

## 2018-05-12 DIAGNOSIS — R202 Paresthesia of skin: Secondary | ICD-10-CM

## 2018-05-12 DIAGNOSIS — M5136 Other intervertebral disc degeneration, lumbar region: Secondary | ICD-10-CM

## 2018-05-15 ENCOUNTER — Encounter: Payer: Self-pay | Admitting: Cardiology

## 2018-05-17 ENCOUNTER — Other Ambulatory Visit: Payer: Self-pay | Admitting: Physician Assistant

## 2018-05-17 DIAGNOSIS — G43909 Migraine, unspecified, not intractable, without status migrainosus: Secondary | ICD-10-CM

## 2018-05-23 ENCOUNTER — Ambulatory Visit
Admission: RE | Admit: 2018-05-23 | Discharge: 2018-05-23 | Disposition: A | Discharge status: Home / Self Care | Payer: Commercial Managed Care - PPO | Source: Ambulatory Visit | Attending: Family Medicine | Admitting: Family Medicine

## 2018-05-23 DIAGNOSIS — M5136 Other intervertebral disc degeneration, lumbar region: Secondary | ICD-10-CM

## 2018-05-23 DIAGNOSIS — R202 Paresthesia of skin: Secondary | ICD-10-CM

## 2018-06-07 ENCOUNTER — Ambulatory Visit (INDEPENDENT_AMBULATORY_CARE_PROVIDER_SITE_OTHER): Payer: Commercial Managed Care - PPO

## 2018-06-07 DIAGNOSIS — I639 Cerebral infarction, unspecified: Secondary | ICD-10-CM | POA: Diagnosis not present

## 2018-06-08 NOTE — Progress Notes (Signed)
[facility] Summary Report / Loop Recorder 

## 2018-07-10 ENCOUNTER — Ambulatory Visit (INDEPENDENT_AMBULATORY_CARE_PROVIDER_SITE_OTHER): Payer: Commercial Managed Care - PPO

## 2018-07-10 DIAGNOSIS — I639 Cerebral infarction, unspecified: Secondary | ICD-10-CM | POA: Diagnosis not present

## 2018-07-10 LAB — CUP PACEART REMOTE DEVICE CHECK
Implantable Pulse Generator Implant Date: 20170405
MDC IDC SESS DTM: 20191223134139

## 2018-07-11 NOTE — Progress Notes (Signed)
[facility] Summary Report / Loop Recorder 

## 2018-07-23 LAB — CUP PACEART REMOTE DEVICE CHECK
MDC IDC PG IMPLANT DT: 20170405
MDC IDC SESS DTM: 20191120123841

## 2018-08-14 ENCOUNTER — Ambulatory Visit (INDEPENDENT_AMBULATORY_CARE_PROVIDER_SITE_OTHER): Payer: Commercial Managed Care - PPO

## 2018-08-14 DIAGNOSIS — I639 Cerebral infarction, unspecified: Secondary | ICD-10-CM | POA: Diagnosis not present

## 2018-08-15 LAB — CUP PACEART REMOTE DEVICE CHECK
Date Time Interrogation Session: 20200125133812
Implantable Pulse Generator Implant Date: 20170405

## 2018-08-16 NOTE — Progress Notes (Signed)
[facility] Summary Report / Loop Recorder 

## 2018-09-14 ENCOUNTER — Ambulatory Visit (INDEPENDENT_AMBULATORY_CARE_PROVIDER_SITE_OTHER): Payer: Commercial Managed Care - PPO | Admitting: *Deleted

## 2018-09-14 DIAGNOSIS — I639 Cerebral infarction, unspecified: Secondary | ICD-10-CM

## 2018-09-15 LAB — CUP PACEART REMOTE DEVICE CHECK
Implantable Pulse Generator Implant Date: 20170405
MDC IDC SESS DTM: 20200227133900

## 2018-09-20 NOTE — Progress Notes (Signed)
[facility] Summary Report / Loop Recorder 

## 2018-09-22 ENCOUNTER — Other Ambulatory Visit: Payer: Self-pay | Admitting: Cardiovascular Disease

## 2018-10-17 ENCOUNTER — Other Ambulatory Visit: Payer: Self-pay

## 2018-10-17 ENCOUNTER — Ambulatory Visit (INDEPENDENT_AMBULATORY_CARE_PROVIDER_SITE_OTHER): Payer: Commercial Managed Care - PPO | Admitting: *Deleted

## 2018-10-17 DIAGNOSIS — I639 Cerebral infarction, unspecified: Secondary | ICD-10-CM

## 2018-10-17 LAB — CUP PACEART REMOTE DEVICE CHECK
Date Time Interrogation Session: 20200331130543
Implantable Pulse Generator Implant Date: 20170405

## 2018-10-25 NOTE — Progress Notes (Signed)
[facility] Summary Report / Loop Recorder 

## 2018-10-26 ENCOUNTER — Telehealth: Payer: Self-pay

## 2018-10-26 NOTE — Telephone Encounter (Signed)
LEFT VM FOR PT TO RETURN CALL TO OFFICE FOR APPT CHANGE TO VIRTUAL

## 2018-10-27 ENCOUNTER — Telehealth: Payer: Self-pay

## 2018-10-27 NOTE — Telephone Encounter (Signed)
Virtual Visit Pre-Appointment Phone Call  Steps For Call:  1. Confirm consent - "In the setting of the current Covid19 crisis, you are scheduled for a (phone or video) visit with your provider on (date) at (time).  Just as we do with many in-office visits, in order for you to participate in this visit, we must obtain consent.  If you'd like, I can send this to your mychart (if signed up) or email for you to review.  Otherwise, I can obtain your verbal consent now.  All virtual visits are billed to your insurance company just like a normal visit would be.  By agreeing to a virtual visit, we'd like you to understand that the technology does not allow for your provider to perform an examination, and thus may limit your provider's ability to fully assess your condition.  Finally, though the technology is pretty good, we cannot assure that it will always work on either your or our end, and in the setting of a video visit, we may have to convert it to a phone-only visit.  In either situation, we cannot ensure that we have a secure connection.  Are you willing to proceed?"  2. Give patient instructions for WebEx download to smartphone as below if video visit  3. Advise patient to be prepared with any vital sign or heart rhythm information, their current medicines, and a piece of paper and pen handy for any instructions they may receive the day of their visit  4. Inform patient they will receive a phone call 15 minutes prior to their appointment time (may be from unknown caller ID) so they should be prepared to answer  5. Confirm that appointment type is correct in Epic appointment notes (video vs telephone)    TELEPHONE CALL NOTE  Raven [name] has been deemed a candidate for a follow-up tele-health visit to limit community exposure during the Covid-19 pandemic. I spoke with the patient via phone to ensure availability of phone/video source, confirm preferred email & phone number, and discuss  instructions and expectations.  I reminded Raven [name] to be prepared with any vital sign and/or heart rhythm information that could potentially be obtained via home monitoring, at the time of her visit. I reminded Raven [name] to expect a phone call at the time of her visit if her visit.  Did the patient verbally acknowledge consent to treatment? Yes  Raven [name], Raven [name] 10/27/2018 11:51 AM   DOWNLOADING THE WEBEX SOFTWARE TO SMARTPHONE  - If Apple, go to [employer] and type in WebEx in the search bar. [place] [employer], the blue/green circle. The app is free but as with any other app downloads, their phone may require them to verify saved payment information or Apple password. The patient does NOT have to create an account.  - If Android, ask patient to go to [employer] and type in WebEx in the search bar. [place] [employer], the blue/green circle. The app is free but as with any other app downloads, their phone may require them to verify saved payment information or Android password. The patient does NOT have to create an account.   CONSENT FOR TELE-HEALTH VISIT - PLEASE REVIEW  I hereby voluntarily request, consent and authorize [facility] and its employed or contracted physicians, physician assistants, nurse practitioners or other licensed health care professionals (the Practitioner), to provide me with telemedicine health care services (the "Services") as deemed necessary by the treating Practitioner. I  acknowledge and consent to receive the Services by the Practitioner via telemedicine. I understand that the telemedicine visit will involve communicating with the Practitioner through live audiovisual communication technology and the disclosure of certain medical information by electronic transmission. I acknowledge that I have been given the opportunity to request an in-person assessment or other available alternative prior to the telemedicine  visit and am voluntarily participating in the telemedicine visit.  I understand that I have the right to withhold or withdraw my consent to the use of telemedicine in the course of my care at any time, without affecting my right to future care or treatment, and that the Practitioner or I may terminate the telemedicine visit at any time. I understand that I have the right to inspect all information obtained and/or recorded in the course of the telemedicine visit and may receive copies of available information for a reasonable fee.  I understand that some of the potential risks of receiving the Services via telemedicine include:  Marland Kitchen Delay or interruption in medical evaluation due to technological equipment failure or disruption; . Information transmitted may not be sufficient (e.g. poor resolution of images) to allow for appropriate medical decision making by the Practitioner; and/or  . In rare instances, security protocols could fail, causing a breach of personal health information.  Furthermore, I acknowledge that it is my responsibility to provide information about my medical history, conditions and care that is complete and accurate to the best of my ability. I acknowledge that Practitioner's advice, recommendations, and/or decision may be based on factors not within their control, such as incomplete or inaccurate data provided by me or distortions of diagnostic images or specimens that may result from electronic transmissions. I understand that the practice of medicine is not an exact science and that Practitioner makes no warranties or guarantees regarding treatment outcomes. I acknowledge that I will receive a copy of this consent concurrently upon execution via email to the email address I last provided but may also request a printed copy by calling the office of [place].    I understand that my insurance will be billed for this visit.   I have read or had this consent read to me. . I  understand the contents of this consent, which adequately explains the benefits and risks of the Services being provided via telemedicine.  . I have been provided ample opportunity to ask questions regarding this consent and the Services and have had my questions answered to my satisfaction. . I give my informed consent for the services to be provided through the use of telemedicine in my medical care  By participating in this telemedicine visit I agree to the above.

## 2018-10-30 ENCOUNTER — Telehealth: Payer: Self-pay | Admitting: Physician Assistant

## 2018-10-30 NOTE — Telephone Encounter (Addendum)
Smartphone/ my chart via email/ consented to virtual visit/pre reg completed

## 2018-10-30 NOTE — Telephone Encounter (Signed)
Smartphone/ my chart via email/ pre reg completed

## 2018-10-31 ENCOUNTER — Telehealth: Payer: Commercial Managed Care - PPO | Admitting: Physician Assistant

## 2018-10-31 ENCOUNTER — Telehealth (INDEPENDENT_AMBULATORY_CARE_PROVIDER_SITE_OTHER): Payer: Commercial Managed Care - PPO | Admitting: Physician Assistant

## 2018-10-31 ENCOUNTER — Encounter: Payer: Self-pay | Admitting: Physician Assistant

## 2018-10-31 VITALS — BP 119/85 | Ht 68.0 in | Wt 179.0 lb

## 2018-10-31 DIAGNOSIS — E785 Hyperlipidemia, unspecified: Secondary | ICD-10-CM

## 2018-10-31 DIAGNOSIS — I10 Essential (primary) hypertension: Secondary | ICD-10-CM | POA: Diagnosis not present

## 2018-10-31 DIAGNOSIS — I639 Cerebral infarction, unspecified: Secondary | ICD-10-CM

## 2018-10-31 NOTE — Progress Notes (Signed)
Virtual Visit via Video Note   This visit type was conducted due to national recommendations for restrictions regarding the COVID-19 Pandemic (e.g. social distancing) in an effort to limit this patient's exposure and mitigate transmission in our community.  Due to her co-morbid illnesses, this patient is at least at moderate risk for complications without adequate follow up.  This format is felt to be most appropriate for this patient at this time.  All issues noted in this document were discussed and addressed.  A limited physical exam was performed with this format.  Please refer to the patient's chart for her consent to telehealth for [facility].   Evaluation Performed:  Follow-up visit  Date:  10/31/2018   ID:  Raven [name], DOB March 04, 1956, MRN [id number]  Patient Location: Home  Provider Location: Home  PCP:  Janie Morning, DO  Cardiologist:  Sanda Klein, MD  Electrophysiologist:  None   Chief Complaint:  Annual followup  History of Present Illness:    Raven [name] is a 63 y.o. female who presents via audio/video conferencing for a telehealth visit today.    She is a 63 year old female with past medical history of hyperlipidemia, hypertension, and a cryptogenic stroke that occurred in April 2017.  MRI showed several acute posterior left MCA territory cortical and white matter infarcts.  CT angiogram of the head and neck did not show significant stenosis.  TEE was negative for cardiogenic source of stroke.  She also has a implantable loop recorder that has not shown any atrial fibrillation to date.  Her last appointment with Dr. Sallyanne Kuster was on 10/31/2017 at which time she was doing well.  Patient was contacted to doximity video conference telehealth visit.  She denies any recent chest pain, shortness of breath or dizziness.  She does not have any recurrence of CVA or TIA.  Overall she has been doing very well.  Last lipid panel was obtained in April of last year  which showed very well-controlled cholesterol.  This year due to COVID-19 pandemic, she has not been able to obtain her usual lab work.  She plan to obtain her next lipid panel at her PCPs office.  Overall her blood pressure is very well controlled on the current medication.  Her previous device interrogation failed to show any occurrence of atrial fibrillation to explain her previous cryptogenic stroke.  Her recorder battery likely will read out in the next year.  We did fully discuss what to do afterward whether to leave the device inside after battery dies versus taking it out via another procedure.  I think she is doing quite well and can follow-up with Dr. Shirlee More in 9 months.  The patient does not have symptoms concerning for COVID-19 infection (fever, chills, cough, or new shortness of breath).    Past Medical History:  Diagnosis Date  . Abnormal Pap smear 08/2005  . Cancer (HCC)    cervical  . Diverticulosis   . History of migraines   . Hypertension   . Kidney stones 2012  . Menopause   . Migraine   . PFO (patent foramen ovale)   . Seasonal allergies   . Stroke [facility])    Past Surgical History:  Procedure Laterality Date  . BUNIONECTOMY  1987  . EP IMPLANTABLE DEVICE N/A 10/22/2015   Procedure: Loop Recorder Insertion;  Surgeon: Will Meredith Leeds, MD;  Location: [place] [facility];  Service: Cardiovascular;  Laterality: N/A;  . NM MYOCAR PERF WALL MOTION  02/28/2008  normal  . partial mastectomy  2008  . TEE WITHOUT CARDIOVERSION N/A 10/22/2015   Procedure: TRANSESOPHAGEAL ECHOCARDIOGRAM (TEE);  Surgeon: Skeet Latch, MD;  [facility] END[facility];  Service: Cardiovascular;  Laterality: N/A;  . TOTAL ABDOMINAL HYSTERECTOMY     cervical cancer  . TUBAL LIGATION  1988  . US ECHOCARDIOGRAPHY  03/21/2008   Trace MR,TR & PR, positive saline contrast bubble study     Current Meds  Medication Sig  . amLODipine (NORVASC) 2.5 MG tablet Take 1 tablet (2.5 mg total) by mouth daily.   . Ascorbic Acid (VITAMIN C) 1000 MG tablet Take 1,000 mg by mouth every evening.  Marland Kitchen aspirin 325 MG EC tablet Take 1 tablet (325 mg total) by mouth daily.  Marland Kitchen atorvastatin (LIPITOR) 10 MG tablet Take 1 tablet (10 mg total) by mouth daily at 6 PM.  . BYSTOLIC 10 MG tablet TAKE 1 TABLET BY MOUTH EVERY DAY (01/01/18)  . Calcium Carbonate-Vitamin D (CALCIUM + D PO) Take 1 tablet by mouth every evening.   Marland Kitchen esomeprazole (NEXIUM) 20 MG capsule Take 20 mg by mouth daily at 12 noon.  . estrogens, conjugated, (PREMARIN) 0.3 MG tablet Take 0.625 mg by mouth daily. Take daily for 21 days then do not take for 7 days.   . Multiple Vitamin (MULTIVITAMIN PO) Take by mouth.    . promethazine (PHENERGAN) 25 MG suppository PLACE 1 SUPPOSITORY (25 MG TOTAL) RECTALLY EVERY 6 (SIX) HOURS AS NEEDED FOR NAUSEA.  . SUMAtriptan (IMITREX) 100 MG tablet TAKE 1 TAB BY MOUTH EVERY 2 HOURS AS NEEDED FOR MIGRAINE MAY REPEAT IN 2 HOURS IF PERSISTS OR RECURS     Allergies:   Morphine and related; Codeine; and Sulfa antibiotics   Social History   Tobacco Use  . Smoking status: Never Smoker  . Smokeless tobacco: Never Used  Substance Use Topics  . Alcohol use: Yes    Alcohol/week: 1.0 standard drinks    Types: 1 Glasses of wine per week    Comment: rare / wine  . Drug use: No     Family Hx: The patient's family history includes Heart disease in her father and mother; Hypertension in her father.  ROS:   Please see the history of present illness.     All other systems reviewed and are negative.   Prior CV studies:   The following studies were reviewed today:  Echo 10/21/2015 LV EF: 55% -   60%  Study Conclusions  - Left ventricle: The cavity size was normal. There was mild focal   basal hypertrophy of the septum. Systolic function was normal.   The estimated ejection fraction was in the range of 55% to 60%.   Wall motion was normal; there were no regional wall motion   abnormalities. Doppler parameters are  consistent with abnormal   left ventricular relaxation (grade 1 diastolic dysfunction). - Aortic valve: There was trivial regurgitation.  Impressions:  - Normal LV systolic function; grade 1 diastolic dysfunction; trace   AI and MR; mild TR.  Labs/Other Tests and Data Reviewed:    EKG:  An ECG dated 10/31/2017 was personally reviewed today and demonstrated:  Sinus bradycardia without significant ST-T wave changes  Recent Labs: 10/31/2017: ALT 16; BUN 13; Creatinine, Ser 1.13; Potassium 3.8; Sodium 141   Recent Lipid Panel Lab Results  Component Value Date/Time   CHOL 142 10/31/2017 09:37 AM   TRIG 108 10/31/2017 09:37 AM   HDL 52 10/31/2017 09:37 AM   CHOLHDL 2.7 10/31/2017  09:37 AM   CHOLHDL 2.5 05/10/2016 08:02 AM   LDLCALC 68 10/31/2017 09:37 AM    Wt Readings from Last 3 Encounters:  10/31/18 179 lb (81.2 kg)  10/31/17 189 lb (85.7 kg)  07/05/17 190 lb 3.2 oz (86.3 kg)     Objective:    Vital Signs:  BP 119/85   Ht 5\' 8"  (1.727 m)   Wt 179 lb (81.2 kg)   BMI 27.22 kg/m    Well nourished, well developed female in no acute distress.   ASSESSMENT & PLAN:    1. Hypertension: Blood pressure very well controlled on amlodipine and Bystolic.  2. Hyperlipidemia: Continue low-dose Lipitor, repeat lipid panel at PCPs office in the next few month.  3. Cryptogenic stroke s/p loop recorder: No atrial fibrillation on repeat interrogation.  COVID-19 Education: The signs and symptoms of COVID-19 were discussed with the patient and how to seek care for testing (follow up with PCP or arrange E-visit).  The importance of social distancing was discussed today.  Time:   Today, I have spent 10 minutes with the patient with telehealth technology discussing the above problems.     Medication Adjustments/Labs and Tests Ordered: Current medicines are reviewed at length with the patient today.  Concerns regarding medicines are outlined above.   Tests Ordered: No orders of the  defined types were placed in this encounter.   Medication Changes: No orders of the defined types were placed in this encounter.   Disposition:  Follow up in 9 month(s)  Signed, Almyra Deforest, [place]  10/31/2018 8:51 AM    [facility]

## 2018-10-31 NOTE — Patient Instructions (Signed)
Medication Instructions:   Your physician recommends that you continue on your current medications as directed. Please refer to the Current Medication list given to you today.  If you need a refill on your cardiac medications before your next appointment, please call your pharmacy.   Lab work: Follow up with your Primary Care Doctor to have your Lipid labs (blood work) drawn  If you have labs (blood work) drawn today and your tests are completely normal, you will receive your results only by: Marland Kitchen MyChart Message (if you have MyChart) OR . A paper copy in the mail If you have any lab test that is abnormal or we need to change your treatment, we will call you to review the results.  Testing/Procedures: NONE ordered at this time of appointment   Follow-Up: At [facility], you and your health needs are our priority.  As part of our continuing mission to provide you with exceptional heart care, we have created designated Provider Care Teams.  These Care Teams include your primary Cardiologist (physician) and Advanced Practice Providers (APPs -  Physician Assistants and Nurse Practitioners) who all work together to provide you with the care you need, when you need it. You will need a follow up appointment in 9 months.  Please call our office 2 months in advance to schedule this appointment.  You may see No primary care provider on file. or one of the following Advanced Practice Providers on your designated Care Team: Almyra Deforest, [place] . Fabian Sharp, PA-C  Any Other Special Instructions Will Be Listed Below (If Applicable).

## 2018-11-20 ENCOUNTER — Other Ambulatory Visit: Payer: Self-pay

## 2018-11-20 ENCOUNTER — Ambulatory Visit (INDEPENDENT_AMBULATORY_CARE_PROVIDER_SITE_OTHER): Payer: Commercial Managed Care - PPO | Admitting: *Deleted

## 2018-11-20 DIAGNOSIS — I639 Cerebral infarction, unspecified: Secondary | ICD-10-CM | POA: Diagnosis not present

## 2018-11-20 LAB — CUP PACEART REMOTE DEVICE CHECK
Date Time Interrogation Session: 20200503144139
Implantable Pulse Generator Implant Date: 20170405

## 2018-11-27 NOTE — Progress Notes (Signed)
[facility] Summary Report / Loop Recorder 

## 2018-12-01 ENCOUNTER — Other Ambulatory Visit: Payer: Self-pay | Admitting: Cardiovascular Disease

## 2018-12-01 MED ORDER — ATORVASTATIN CALCIUM 10 MG PO TABS: 10.0000 mg | ORAL_TABLET | Freq: Every day | ORAL | 3 refills | Status: DC

## 2018-12-01 NOTE — Telephone Encounter (Signed)
°*  STAT* If patient is at the pharmacy, call can be transferred to refill team.   1. Which medications need to be refilled? (please list name of each medication and dose if known) atorvastatin (LIPITOR) 10 MG tablet  2. Which pharmacy/location (including street and city if local pharmacy) is medication to be sent to? [id number] - [place], [place] - [address]  3. Do they need a 30 day or 90 day supply? 90 days

## 2018-12-20 ENCOUNTER — Other Ambulatory Visit: Payer: Self-pay | Admitting: Cardiovascular Disease

## 2018-12-22 ENCOUNTER — Ambulatory Visit (INDEPENDENT_AMBULATORY_CARE_PROVIDER_SITE_OTHER): Payer: Commercial Managed Care - PPO | Admitting: *Deleted

## 2018-12-22 DIAGNOSIS — I639 Cerebral infarction, unspecified: Secondary | ICD-10-CM | POA: Diagnosis not present

## 2018-12-22 LAB — CUP PACEART REMOTE DEVICE CHECK
Date Time Interrogation Session: 20200605150756
Implantable Pulse Generator Implant Date: 20170405

## 2018-12-29 NOTE — Progress Notes (Signed)
[facility] Summary Report / Loop Recorder 

## 2019-01-24 ENCOUNTER — Ambulatory Visit (INDEPENDENT_AMBULATORY_CARE_PROVIDER_SITE_OTHER): Payer: Commercial Managed Care - PPO | Admitting: *Deleted

## 2019-01-24 DIAGNOSIS — I639 Cerebral infarction, unspecified: Secondary | ICD-10-CM | POA: Diagnosis not present

## 2019-01-24 LAB — CUP PACEART REMOTE DEVICE CHECK
Date Time Interrogation Session: 20200708161132
Implantable Pulse Generator Implant Date: 20170405

## 2019-02-05 NOTE — Progress Notes (Signed)
[facility] Summary Report / Loop Recorder 

## 2019-02-26 ENCOUNTER — Ambulatory Visit (INDEPENDENT_AMBULATORY_CARE_PROVIDER_SITE_OTHER): Payer: Commercial Managed Care - PPO | Admitting: *Deleted

## 2019-02-26 DIAGNOSIS — I639 Cerebral infarction, unspecified: Secondary | ICD-10-CM

## 2019-02-26 LAB — CUP PACEART REMOTE DEVICE CHECK
Date Time Interrogation Session: 20200810163710
Implantable Pulse Generator Implant Date: 20170405

## 2019-03-07 NOTE — Progress Notes (Signed)
[facility] Summary Report / Loop Recorder 

## 2019-03-09 ENCOUNTER — Telehealth: Payer: Self-pay

## 2019-03-09 NOTE — Telephone Encounter (Signed)
Left message for patient to remind of missed remote transmission.  

## 2019-03-22 ENCOUNTER — Encounter: Payer: Self-pay | Admitting: Cardiology

## 2019-04-02 ENCOUNTER — Other Ambulatory Visit: Payer: Self-pay

## 2019-04-02 ENCOUNTER — Encounter: Payer: Commercial Managed Care - PPO | Admitting: *Deleted

## 2019-04-12 ENCOUNTER — Encounter: Payer: Self-pay | Admitting: Cardiology

## 2019-04-26 ENCOUNTER — Other Ambulatory Visit: Payer: Self-pay

## 2019-04-26 DIAGNOSIS — Z20822 Contact with and (suspected) exposure to covid-19: Secondary | ICD-10-CM

## 2019-04-27 LAB — NOVEL CORONAVIRUS, NAA: SARS-CoV-2, NAA: NOT DETECTED

## 2019-05-21 ENCOUNTER — Other Ambulatory Visit: Payer: Self-pay | Admitting: Physician Assistant

## 2019-05-21 DIAGNOSIS — G43909 Migraine, unspecified, not intractable, without status migrainosus: Secondary | ICD-10-CM

## 2019-05-28 ENCOUNTER — Telehealth: Payer: Self-pay | Admitting: Radiology

## 2019-05-28 NOTE — Telephone Encounter (Signed)
Left message for patient to let her know that Allie Dimmer stated that she will have to come to headache f/u appointment in order to receive a refill for emgality

## 2019-06-22 ENCOUNTER — Other Ambulatory Visit: Payer: Self-pay

## 2019-06-22 ENCOUNTER — Ambulatory Visit: Payer: Commercial Managed Care - PPO | Admitting: Physician Assistant

## 2019-06-22 ENCOUNTER — Encounter: Payer: Self-pay | Admitting: Physician Assistant

## 2019-06-22 VITALS — BP 131/86 | HR 72 | Wt 197.0 lb

## 2019-06-22 DIAGNOSIS — IMO0002 Reserved for concepts with insufficient information to code with codable children: Secondary | ICD-10-CM

## 2019-06-22 DIAGNOSIS — G43909 Migraine, unspecified, not intractable, without status migrainosus: Secondary | ICD-10-CM | POA: Diagnosis not present

## 2019-06-22 DIAGNOSIS — G479 Sleep disorder, unspecified: Secondary | ICD-10-CM | POA: Diagnosis not present

## 2019-06-22 DIAGNOSIS — G43009 Migraine without aura, not intractable, without status migrainosus: Secondary | ICD-10-CM

## 2019-06-22 DIAGNOSIS — G43709 Chronic migraine without aura, not intractable, without status migrainosus: Secondary | ICD-10-CM

## 2019-06-22 MED ORDER — PROMETHAZINE HCL 25 MG RE SUPP: 25.0000 mg | Freq: Four times a day (QID) | RECTAL | 3 refills | Status: AC | PRN

## 2019-06-22 MED ORDER — SUMATRIPTAN SUCCINATE 100 MG PO TABS: ORAL_TABLET | ORAL | 11 refills | Status: AC

## 2019-06-22 NOTE — Progress Notes (Signed)
History:  Raven [name] is a 63 y.o. G3P3003 who presents to clinic today for headache eval.  She has not been here in 2 years.   She has continued to use Imitrex without issue.  Her bp has been fine.  No more issues with stroke since 2017.  A cause or contributing factor of the stroke has not been identified.   Her headaches have been terrible.  They are occurring most days.  She uses imitrex often.  Imitrex is the only thing that has helped.   She was in a study for CGRP blocker but did not get any benefit.   She notes she gets up often during the night to use the bathroom.  She snores significantly.  She is not certain if she stops breathing but notes it could be a possibility (and perhaps that's why she has to go to the bathroom so frequently).  She does not feel well rested.     Past Medical History:  Diagnosis Date  . Abnormal Pap smear 08/2005  . Cancer (HCC)    cervical  . Diverticulosis   . History of migraines   . Hypertension   . Kidney stones 2012  . Menopause   . Migraine   . PFO (patent foramen ovale)   . Seasonal allergies   . Stroke [facility])     Social History   Socioeconomic History  . Marital status: Married    Spouse name: Not on file  . Number of children: Not on file  . Years of education: Not on file  . Highest education level: Not on file  Occupational History  . Not on file  Social Needs  . Financial resource strain: Not on file  . Food insecurity    Worry: Not on file    Inability: Not on file  . Transportation needs    Medical: Not on file    Non-medical: Not on file  Tobacco Use  . Smoking status: Never Smoker  . Smokeless tobacco: Never Used  Substance and Sexual Activity  . Alcohol use: Yes    Alcohol/week: 1.0 standard drinks    Types: 1 Glasses of wine per week    Comment: rare / wine  . Drug use: No  . Sexual activity: Not on file  Lifestyle  . Physical activity    Days per week: Not on file    Minutes per session: Not on file   . Stress: Not on file  Relationships  . Social Herbalist on phone: Not on file    Gets together: Not on file    Attends religious service: Not on file    Active member of club or organization: Not on file    Attends meetings of clubs or organizations: Not on file    Relationship status: Not on file  . Intimate partner violence    Fear of current or ex partner: Not on file    Emotionally abused: Not on file    Physically abused: Not on file    Forced sexual activity: Not on file  Other Topics Concern  . Not on file  Social History Narrative  . Not on file    Family History  Problem Relation Age of Onset  . Heart disease Mother        heart attack  . Heart disease Father        heart attack  . Hypertension Father     Allergies  Allergen Reactions  .  Morphine And Related Nausea Only and Other (See Comments)    "not well tolerated"  . Codeine Nausea Only, Other (See Comments) and Nausea And Vomiting    "not well tolerated"  . Sulfa Antibiotics Itching, Hives and Rash    Current Outpatient Medications on File Prior to Visit  Medication Sig Dispense Refill  . amLODipine (NORVASC) 2.5 MG tablet TAKE 1 TABLET BY MOUTH EVERY DAY 90 tablet 3  . Ascorbic Acid (VITAMIN C) 1000 MG tablet Take 1,000 mg by mouth every evening.    Marland Kitchen aspirin 325 MG EC tablet Take 1 tablet (325 mg total) by mouth daily. 30 tablet 1  . atorvastatin (LIPITOR) 10 MG tablet Take 1 tablet (10 mg total) by mouth daily at 6 PM. 90 tablet 3  . BYSTOLIC 10 MG tablet TAKE 1 TABLET BY MOUTH EVERY DAY (01/01/18) 90 tablet 3  . Calcium Carbonate-Vitamin D (CALCIUM + D PO) Take 1 tablet by mouth every evening.     Marland Kitchen esomeprazole (NEXIUM) 20 MG capsule Take 20 mg by mouth daily at 12 noon.    . estrogens, conjugated, (PREMARIN) 0.3 MG tablet Take 0.625 mg by mouth daily. Take daily for 21 days then do not take for 7 days.     . Multiple Vitamin (MULTIVITAMIN PO) Take by mouth.      . predniSONE (STERAPRED  UNI-PAK 21 TAB) 10 MG (21) TBPK tablet Take by mouth daily. Take as directed 21 tablet 0  . promethazine (PHENERGAN) 25 MG suppository PLACE 1 SUPPOSITORY (25 MG TOTAL) RECTALLY EVERY 6 (SIX) HOURS AS NEEDED FOR NAUSEA. 12 suppository 3  . SUMAtriptan (IMITREX) 100 MG tablet TAKE 1 TAB BY MOUTH EVERY 2 HOURS AS NEEDED FOR MIGRAINE MAY REPEAT IN 2 HOURS IF PERSISTS OR RECURS 18 tablet 11   No current facility-administered medications on file prior to visit.      Review of Systems:  All pertinent positive/negative included in HPI, all other review of systems are negative   Objective:  Physical Exam BP 131/86   Pulse 72   Wt 197 lb (89.4 kg)   BMI 29.95 kg/m  CONSTITUTIONAL: Well-developed, well-nourished female in no acute distress.  EYES: EOM intact ENT: Normocephalic CARDIOVASCULAR: Regular rateRESPIRATORY: Normal rate.  MUSCULOSKELETAL: Normal ROM SKIN: Warm, dry without erythema  NEUROLOGICAL: Alert, oriented, CN II-XII grossly intact, Appropriate balance PSYCH: Normal behavior, mood   Assessment & Plan:  Assessment: 1. Chronic migraine   2. Sick headache   3. Migraine without aura and without status migrainosus, not intractable   4. Sleep difficulties   No improvement in headaches.  Sleep is new problem   Plan: Sleep study ordered. Apnea could certainly be causing worsening of migraine.  Trial of Ubrelvy/Nurtec for acute HA.  Either of these would be a safer alternative to Imitrex.  Pt may call after trying for a prescription.  Samples/info/Savings card provided.  Pt reminded that Imitrex should not be taken if her BP is elevated.   Pt may trial Emgality to see if she gets benefit from that (as she was in alternative study).  She will call if rx is needed.   Health lifestyle addressed.   Follow-up in 12 months or sooner PRN  Paticia Stack, PA-C 06/22/2019 8:18 AM

## 2019-06-25 DIAGNOSIS — IMO0002 Reserved for concepts with insufficient information to code with codable children: Secondary | ICD-10-CM | POA: Insufficient documentation

## 2019-06-25 DIAGNOSIS — G43709 Chronic migraine without aura, not intractable, without status migrainosus: Secondary | ICD-10-CM | POA: Insufficient documentation

## 2019-06-25 DIAGNOSIS — G479 Sleep disorder, unspecified: Secondary | ICD-10-CM | POA: Insufficient documentation

## 2019-06-25 NOTE — Patient Instructions (Signed)

## 2019-10-21 ENCOUNTER — Other Ambulatory Visit: Payer: Self-pay | Admitting: Cardiovascular Disease

## 2019-11-05 MED ORDER — NEBIVOLOL HCL 10 MG PO TABS: 10.0000 mg | ORAL_TABLET | Freq: Every day | ORAL | 0 refills | Status: DC

## 2019-11-08 ENCOUNTER — Encounter: Payer: Self-pay | Admitting: Cardiovascular Disease

## 2019-11-08 ENCOUNTER — Telehealth: Payer: Self-pay | Admitting: *Deleted

## 2019-11-08 ENCOUNTER — Telehealth (INDEPENDENT_AMBULATORY_CARE_PROVIDER_SITE_OTHER): Payer: Commercial Managed Care - PPO | Admitting: Cardiovascular Disease

## 2019-11-08 VITALS — BP 114/83 | HR 79 | Ht 68.0 in | Wt 182.0 lb

## 2019-11-08 DIAGNOSIS — E663 Overweight: Secondary | ICD-10-CM

## 2019-11-08 DIAGNOSIS — I10 Essential (primary) hypertension: Secondary | ICD-10-CM | POA: Diagnosis not present

## 2019-11-08 DIAGNOSIS — E7800 Pure hypercholesterolemia, unspecified: Secondary | ICD-10-CM | POA: Diagnosis not present

## 2019-11-08 DIAGNOSIS — Z8673 Personal history of transient ischemic attack (TIA), and cerebral infarction without residual deficits: Secondary | ICD-10-CM | POA: Diagnosis not present

## 2019-11-08 MED ORDER — NEBIVOLOL HCL 10 MG PO TABS: 10.0000 mg | ORAL_TABLET | Freq: Every day | ORAL | 3 refills | Status: DC

## 2019-11-08 NOTE — Patient Instructions (Signed)

## 2019-11-08 NOTE — Telephone Encounter (Signed)
  Patient Consent for Virtual Visit         Raven [name] has provided verbal consent on 11/08/2019 for a virtual visit (video or telephone).   CONSENT FOR VIRTUAL VISIT FOR:  Raven [name]  By participating in this virtual visit I agree to the following:  I hereby voluntarily request, consent and authorize [facility] and its employed or contracted physicians, physician assistants, nurse practitioners or other licensed health care professionals (the Practitioner), to provide me with telemedicine health care services (the "Services") as deemed necessary by the treating Practitioner. I acknowledge and consent to receive the Services by the Practitioner via telemedicine. I understand that the telemedicine visit will involve communicating with the Practitioner through live audiovisual communication technology and the disclosure of certain medical information by electronic transmission. I acknowledge that I have been given the opportunity to request an in-person assessment or other available alternative prior to the telemedicine visit and am voluntarily participating in the telemedicine visit.  I understand that I have the right to withhold or withdraw my consent to the use of telemedicine in the course of my care at any time, without affecting my right to future care or treatment, and that the Practitioner or I may terminate the telemedicine visit at any time. I understand that I have the right to inspect all information obtained and/or recorded in the course of the telemedicine visit and may receive copies of available information for a reasonable fee.  I understand that some of the potential risks of receiving the Services via telemedicine include:  Marland Kitchen Delay or interruption in medical evaluation due to technological equipment failure or disruption; . Information transmitted may not be sufficient (e.g. poor resolution of images) to allow for appropriate medical decision making by the  Practitioner; and/or  . In rare instances, security protocols could fail, causing a breach of personal health information.  Furthermore, I acknowledge that it is my responsibility to provide information about my medical history, conditions and care that is complete and accurate to the best of my ability. I acknowledge that Practitioner's advice, recommendations, and/or decision may be based on factors not within their control, such as incomplete or inaccurate data provided by me or distortions of diagnostic images or specimens that may result from electronic transmissions. I understand that the practice of medicine is not an exact science and that Practitioner makes no warranties or guarantees regarding treatment outcomes. I acknowledge that a copy of this consent can be made available to me via my patient portal ([facility]), or I can request a printed copy by calling the office of [facility].    I understand that my insurance will be billed for this visit.   I have read or had this consent read to me. . I understand the contents of this consent, which adequately explains the benefits and risks of the Services being provided via telemedicine.  . I have been provided ample opportunity to ask questions regarding this consent and the Services and have had my questions answered to my satisfaction. . I give my informed consent for the services to be provided through the use of telemedicine in my medical care

## 2019-11-08 NOTE — Progress Notes (Signed)
Patient ID: Raven [name], female   DOB: 12/08/1955, 64 y.o.   MRN: [id number]     Virtual Visit via Video Note   This visit type was conducted due to national recommendations for restrictions regarding the COVID-19 Pandemic (e.g. social distancing) in an effort to limit this patient's exposure and mitigate transmission in our community.  Due to her co-morbid illnesses, this patient is at least at moderate risk for complications without adequate follow up.  This format is felt to be most appropriate for this patient at this time.  All issues noted in this document were discussed and addressed.  A limited physical exam was performed with this format.  Please refer to the patient's chart for her consent to telehealth for [facility].   The patient was identified using 2 identifiers.  Date:  11/08/2019   ID:  Raven [name], DOB [birth year], MRN [id number]  Patient Location: Home Provider Location: Home  PCP:  Janie Morning, DO  Cardiologist:  Sanda Klein, MD  Electrophysiologist:  None   Evaluation Performed:  Follow-Up Visit  Chief Complaint: History of cryptogenic stroke, hypertension, hyperlipidemia  History of Present Illness:    Raven [name] is a 64 y.o. female with history of unexplained stroke that occurred roughly 4 years ago (April 2017, MRI "..several small acute posterior left MCA territory cortical and white matter infarcts "), without current residual deficits.  She has hypertension and hypercholesterolemia.  Extensive work-up that has included 3 years of loop recorder monitoring (device now at end of service), CT angiogram of the head and neck, carotid duplex ultrasound and TEE did not reveal an etiology for the stroke.  She has done very well in the last 12 months, without any cardiovascular complaints. The patient specifically denies any chest pain at rest exertion, dyspnea at rest or with exertion, orthopnea, paroxysmal nocturnal dyspnea, syncope,  palpitations, focal neurological deficits, intermittent claudication, lower extremity edema, unexplained weight gain, cough, hemoptysis or wheezing.  She continues to take a very low-dose of conjugated estrogen.  She is exercising regularly.  The patient does not have symptoms concerning for COVID-19 infection (fever, chills, cough, or new shortness of breath).    Past Medical History:  Diagnosis Date  . Abnormal Pap smear 08/2005  . Cancer (HCC)    cervical  . Diverticulosis   . History of migraines   . Hypertension   . Kidney stones 2012  . Menopause   . Migraine   . PFO (patent foramen ovale)   . Seasonal allergies   . Stroke [facility])    Past Surgical History:  Procedure Laterality Date  . BUNIONECTOMY  1987  . EP IMPLANTABLE DEVICE N/A 10/22/2015   Procedure: Loop Recorder Insertion;  Surgeon: Will Meredith Leeds, MD;  Location: [facility] CV LAB;  Service: Cardiovascular;  Laterality: N/A;  . NM MYOCAR PERF WALL MOTION  02/28/2008   normal  . partial mastectomy  2008  . TEE WITHOUT CARDIOVERSION N/A 10/22/2015   Procedure: TRANSESOPHAGEAL ECHOCARDIOGRAM (TEE);  Surgeon: Skeet Latch, MD;  Location[facility] ENDOSCOPY;  Service: Cardiovascular;  Laterality: N/A;  . TOTAL ABDOMINAL HYSTERECTOMY     cervical cancer  . TUBAL LIGATION  1988  . US ECHOCARDIOGRAPHY  03/21/2008   Trace MR,TR & PR, positive saline contrast bubble study     Current Meds  Medication Sig  . amLODipine (NORVASC) 2.5 MG tablet TAKE 1 TABLET BY MOUTH EVERY DAY  . Ascorbic Acid (VITAMIN C) 1000 MG tablet Take 1,000 mg by  mouth every evening.  Marland Kitchen aspirin 325 MG EC tablet Take 1 tablet (325 mg total) by mouth daily.  Marland Kitchen atorvastatin (LIPITOR) 10 MG tablet Take 1 tablet (10 mg total) by mouth daily at 6 PM.  . Calcium Carbonate-Vitamin D (CALCIUM + D PO) Take 1 tablet by mouth every evening.   Marland Kitchen esomeprazole (NEXIUM) 20 MG capsule Take 20 mg by mouth daily at 12 noon.  . estrogens, conjugated, (PREMARIN) 0.3 MG  tablet Take 0.625 mg by mouth daily. Take daily for 21 days then do not take for 7 days.   . Multiple Vitamin (MULTIVITAMIN PO) Take by mouth.    . nebivolol (BYSTOLIC) 10 MG tablet Take 1 tablet (10 mg total) by mouth daily.  . promethazine (PHENERGAN) 25 MG suppository Place 1 suppository (25 mg total) rectally every 6 (six) hours as needed for nausea.  . SUMAtriptan (IMITREX) 100 MG tablet TAKE 1 TAB BY MOUTH EVERY 2 HOURS AS NEEDED FOR MIGRAINE MAY REPEAT IN 2 HOURS IF PERSISTS OR RECURS  . [DISCONTINUED] nebivolol (BYSTOLIC) 10 MG tablet Take 1 tablet (10 mg total) by mouth daily.     Allergies:   Morphine and related, Codeine, and Sulfa antibiotics   Social History   Tobacco Use  . Smoking status: Never Smoker  . Smokeless tobacco: Never Used  Substance Use Topics  . Alcohol use: Yes    Alcohol/week: 1.0 standard drinks    Types: 1 Glasses of wine per week    Comment: rare / wine  . Drug use: No     Family Hx: The patient's family history includes Heart disease in her father and mother; Hypertension in her father.  ROS:   Please see the history of present illness.     All other systems reviewed and are negative.   Prior CV studies:   The following studies were reviewed today:  Labs/Other Tests and Data Reviewed:    EKG:  No ECG reviewed.  Recent Labs: No results found for requested labs within last 8760 hours.   Recent Lipid Panel Lab Results  Component Value Date/Time   CHOL 142 10/31/2017 09:37 AM   TRIG 108 10/31/2017 09:37 AM   HDL 52 10/31/2017 09:37 AM   CHOLHDL 2.7 10/31/2017 09:37 AM   CHOLHDL 2.5 05/10/2016 08:02 AM   LDLCALC 68 10/31/2017 09:37 AM    Wt Readings from Last 3 Encounters:  11/08/19 182 lb (82.6 kg)  06/22/19 197 lb (89.4 kg)  10/31/18 179 lb (81.2 kg)     Objective:    Vital Signs:  BP 114/83   Pulse 79   Ht 5\' 8"  (1.727 m)   Wt 182 lb (82.6 kg)   BMI 27.67 kg/m    VITAL SIGNS:  reviewed GEN:  no acute distress EYES:   sclerae anicteric, EOMI - Extraocular Movements Intact RESPIRATORY:  normal respiratory effort, symmetric expansion CARDIOVASCULAR:  no peripheral edema NEURO:  alert and oriented x 3, no obvious focal deficit PSYCH:  normal affect    ASSESSMENT & PLAN:    1. History of CVA: Clear etiology never identified.  On antiplatelet therapy.  No recurrence. 2. HTN: Excellent control 3. HLP: All lipid parameters were at target when last checked, but she has not had a lipid profile since 2019.  She has an upcoming appointment with her new primary care provider in about a week.  She will have labs checked in. 4. Overweight: She continues efforts at weight loss.  COVID-19 Education: The signs and  symptoms of COVID-19 were discussed with the patient and how to seek care for testing (follow up with PCP or arrange E-visit).  The importance of social distancing was discussed today.  Time:   Today, I have spent 14 minutes with the patient with telehealth technology discussing the above problems.     Medication Adjustments/Labs and Tests Ordered: Current medicines are reviewed at length with the patient today.  Concerns regarding medicines are outlined above.   Tests Ordered: No orders of the defined types were placed in this encounter.   Medication Changes: Meds ordered this encounter  Medications  . nebivolol (BYSTOLIC) 10 MG tablet    Sig: Take 1 tablet (10 mg total) by mouth daily.    Dispense:  90 tablet    Refill:  3    Follow Up:  In Person 1 year  Signed, Sanda Klein, MD  11/08/2019 3:08 PM    [facility]

## 2019-11-19 ENCOUNTER — Other Ambulatory Visit: Payer: Self-pay | Admitting: Family Medicine

## 2019-11-19 DIAGNOSIS — Z1231 Encounter for screening mammogram for malignant neoplasm of breast: Secondary | ICD-10-CM

## 2019-12-19 ENCOUNTER — Other Ambulatory Visit: Payer: Self-pay | Admitting: Cardiovascular Disease

## 2019-12-22 ENCOUNTER — Other Ambulatory Visit: Payer: Self-pay | Admitting: Physician Assistant

## 2020-03-10 ENCOUNTER — Other Ambulatory Visit: Payer: Self-pay | Admitting: *Deleted

## 2020-03-10 MED ORDER — NEBIVOLOL HCL 20 MG PO TABS: 20.0000 mg | ORAL_TABLET | Freq: Every day | ORAL | Status: DC

## 2020-03-22 ENCOUNTER — Telehealth: Payer: Self-pay | Admitting: Physician Assistant

## 2020-03-22 NOTE — Telephone Encounter (Signed)
Pt had been on 2.5 mg amlodipine, 10 mg Bystolic at one point.   It was not refilled by the pharmacy and she ran out.   After being out for > 1 month, she realized and contacted the office. She restarted it as directed.   She started getting HA, stopped it again.   BP readings went up, Dr C went up on the Bystolic to 20 mg qd  She went down on the Bystolic 5 days ago. She restarted the amlodipine 3 days ago.   On the increased dose of Bystolic, she was feeling anxious, could not eat, crawling out of her skin. Could not sleep.   No CP or SOB.   She is very scared.   BP 125/92, HR 67.   Discussed the issues with her. Recommended that she choose a regimen that she thinks she can stand for period of time.   She believes she can stand the Bystolic 10 mg and the amlodipine 2.5 mg qd.   She has an appt with a therapist next Tuesday, she will keep this.   Encouraged Benadryl or melatonin to help her sleep.   Continue to track BP/HR.  Rosaria Ferries, PA-C 03/22/2020 5:07 PM

## 2020-03-22 NOTE — Telephone Encounter (Signed)
Thanks for the followup!  

## 2020-03-24 ENCOUNTER — Telehealth: Payer: Self-pay | Admitting: Physician Assistant

## 2020-03-24 NOTE — Telephone Encounter (Signed)
Pt called again on 09/05. She still cannot sleep, is very anxious.  She realizes she needs something for anxiety.  Says she has appt with therapist next week. Requests short term rx such as a benzo to help her sx until then. Advised her that I was not allowed to prescribe meds like this. Made sure that she is off caffeine, no OTC cold meds and no recreational drugs.   No external source of agitation, recommended she seek help at [facility] or ER to get rx and labs.  Pt states she will do so.  Rosaria Ferries, PA-C 03/23/2020 2:40 PM

## 2020-04-25 ENCOUNTER — Telehealth: Payer: Self-pay | Admitting: Cardiovascular Disease

## 2020-04-25 ENCOUNTER — Other Ambulatory Visit: Payer: Self-pay | Admitting: Urology

## 2020-04-25 NOTE — Telephone Encounter (Signed)
   [facility] Pre-operative Risk Assessment    HEARTCARE STAFF: - Please ensure there is not already an duplicate clearance open for this procedure. - Under Visit Info/Reason for Call, type in Other and utilize the format Clearance MM/DD/YY or Clearance TBD. Do not use dashes or single digits. - If request is for dental extraction, please clarify the # of teeth to be extracted.  Request for surgical clearance:  1. What type of surgery is being performed? Shockwave lithotriphy  2. When is this surgery scheduled? 05/05/20   3. What type of clearance is required (medical clearance vs. Pharmacy clearance to hold med vs. Both)? Both   4. Are there any medications that need to be held prior to surgery and how long? Aspirin; 72 hrs prior to surgery  5. Practice name and name of physician performing surgery? [facility]; [place] Reader Pace  6. What is the office phone number? [phone]   7.   What is the office fax number? [phone]  8.   Anesthesia type (None, local, MAC, general) ? Local    Raven [name] 04/25/2020, 2:54 PM  _________________________________________________________________   (provider comments below)

## 2020-04-25 NOTE — Telephone Encounter (Signed)
   Primary Cardiologist: Sanda Klein, MD  Chart reviewed as part of pre-operative protocol coverage. Given past medical history and time since last visit, based on ACC/AHA guidelines, Raven [name] would be at acceptable risk for the planned procedure without further cardiovascular testing.   Patient can hold ASA as requested.   The patient was advised that if she develops new symptoms prior to surgery to contact our office to arrange for a follow-up visit, and she verbalized understanding.  I will route this recommendation to the requesting party via Epic fax function and remove from pre-op pool.  Please call with questions.  [place], [place] 04/25/2020, 3:51 PM

## 2020-05-01 ENCOUNTER — Ambulatory Visit (INDEPENDENT_AMBULATORY_CARE_PROVIDER_SITE_OTHER): Payer: Commercial Managed Care - PPO | Admitting: Adult Health

## 2020-05-01 ENCOUNTER — Other Ambulatory Visit: Payer: Self-pay

## 2020-05-01 ENCOUNTER — Other Ambulatory Visit (HOSPITAL_COMMUNITY)
Admission: RE | Admit: 2020-05-01 | Discharge: 2020-05-01 | Disposition: A | Discharge status: Home / Self Care | Payer: Commercial Managed Care - PPO | Source: Ambulatory Visit | Attending: Urology | Admitting: Urology

## 2020-05-01 ENCOUNTER — Encounter (HOSPITAL_BASED_OUTPATIENT_CLINIC_OR_DEPARTMENT_OTHER): Payer: Self-pay | Admitting: Urology

## 2020-05-01 ENCOUNTER — Encounter: Payer: Self-pay | Admitting: Adult Health

## 2020-05-01 VITALS — BP 109/63 | HR 87 | Ht 68.0 in | Wt 182.0 lb

## 2020-05-01 DIAGNOSIS — Z20822 Contact with and (suspected) exposure to covid-19: Secondary | ICD-10-CM | POA: Diagnosis not present

## 2020-05-01 DIAGNOSIS — Z01812 Encounter for preprocedural laboratory examination: Secondary | ICD-10-CM | POA: Insufficient documentation

## 2020-05-01 DIAGNOSIS — F331 Major depressive disorder, recurrent, moderate: Secondary | ICD-10-CM

## 2020-05-01 DIAGNOSIS — F411 Generalized anxiety disorder: Secondary | ICD-10-CM

## 2020-05-01 LAB — SARS CORONAVIRUS 2 (TAT 6-24 HRS): SARS Coronavirus 2: NEGATIVE

## 2020-05-01 NOTE — Progress Notes (Signed)
[facility] MD/PA/NP Initial Note  05/01/2020 6:05 PM Raven [name]  MRN:  [id number]  Chief Complaint:   HPI:   Referred by therapist.  Describes mood today as "ok". Pleasant. Mood symptoms - denies depression, anxiety, and irritability. Stating "I am starting to feel better again". Making "small steps". Report increased anxiety and inability to function over the past few months. Feels like decline was related to a work incident possibly culminated with a kidney infection. Stating "it was a severe infection and they wanted to hospitalize me". Couldn't function to even get dressed a few months ago. Stating "I didn't want to be alone". Had the lights on around the clock. Kids coming over to sit with her. Stating "I'm having better days as of late". PCP started her on Hydroxyzine and Buspar. Feels like the medications are helping. Does not wish to make any changes at this time. Improved interest and motivation. Taking medications as prescribed.  Energy levels stable. Active, bike riding - none in last 2 months. Enjoys some usual interests and activities. Married. Lives with husband of 76 years and 2 dogs. Has 3 daughters and 1 son. Spending time with family. Appetite adequate - had difficulties eating for a while - "doing better now". Weight stable. Sleeps well most nights. Averages 6 hours. Had a period in September where she couldn't sleep at all. Focus and concentration stable. Completing tasks. Managing aspects of household. 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 Denies SI or HI.  Denies AH or VH. Seeing therapist x 6 weeks - Bartolo Darter  Previous medication trials: Buspar, Hydroxyzine  Visit Diagnosis: No diagnosis found.  Past Psychiatric History: Admitted early 1980's - 20's - depressed - 2 small children, lost parents, and went through a divorce.  Past Medical History:  Past Medical History:  Diagnosis Date   Abnormal Pap smear 08/2005   Cancer [facility])     cervical   Diverticulosis    History of migraines    Hypertension    Kidney stones 2012   Menopause    Migraine    PFO (patent foramen ovale)    PONV (postoperative nausea and vomiting)    Seasonal allergies    Stroke [facility])     Past Surgical History:  Procedure Laterality Date   BUNIONECTOMY  1987   EP IMPLANTABLE DEVICE N/A 10/22/2015   Procedure: Loop Recorder Insertion;  Surgeon: Will Meredith Leeds, MD;  Location: [facility] CV LAB;  Service: Cardiovascular;  Laterality: N/A;   NM MYOCAR PERF WALL MOTION  02/28/2008   normal   partial mastectomy  2008   TEE WITHOUT CARDIOVERSION N/A 10/22/2015   Procedure: TRANSESOPHAGEAL ECHOCARDIOGRAM (TEE);  Surgeon: Skeet Latch, MD;  Location: [facility] ENDOSCOPY;  Service: Cardiovascular;  Laterality: N/A;   TOTAL ABDOMINAL HYSTERECTOMY     cervical cancer   TUBAL LIGATION  1988   US ECHOCARDIOGRAPHY  03/21/2008   Trace MR,TR & PR, positive saline contrast bubble study    Family Psychiatric History: Reports a family history of mental illness.  Family History:  Family History  Problem Relation Age of Onset   Heart disease Mother        heart attack   Heart disease Father        heart attack   Hypertension Father     Social History:  Social History   Socioeconomic History   Marital status: Married    Spouse name: Not on file   Number of children: Not on file  Years of education: Not on file   Highest education level: Not on file  Occupational History   Not on file  Tobacco Use   Smoking status: Never Smoker   Smokeless tobacco: Never Used  Vaping Use   Vaping Use: Never used  Substance and Sexual Activity   Alcohol use: Yes    Alcohol/week: 1.0 standard drink    Types: 1 Glasses of wine per week    Comment: rare / wine   Drug use: No   Sexual activity: Not on file  Other Topics Concern   Not on file  Social History Narrative   Not on file   Social Determinants of Health    Financial Resource Strain:    Difficulty of Paying Living Expenses: Not on file  Food Insecurity:    Worried About [place] in the Last Year: Not on file   Ran Out of Food in the Last Year: Not on file  Transportation Needs:    Lack of Transportation (Medical): Not on file   Lack of Transportation (Non-Medical): Not on file  Physical Activity:    Days of Exercise per Week: Not on file   Minutes of Exercise per Session: Not on file  Stress:    Feeling of Stress : Not on file  Social Connections:    Frequency of Communication with Friends and Family: Not on file   Frequency of Social Gatherings with Friends and Family: Not on file   Attends Religious Services: Not on file   Active Member of Clubs or Organizations: Not on file   Attends Archivist Meetings: Not on file   Marital Status: Not on file    Allergies:  Allergies  Allergen Reactions   Codeine Nausea Only, Other (See Comments) and Nausea And Vomiting    "not well tolerated"   Morphine Nausea Only and Other (See Comments)    "not well tolerated"    Morphine And Related Nausea Only and Other (See Comments)    "not well tolerated"   Sulfa Antibiotics Itching, Hives and Rash    Metabolic Disorder Labs: Lab Results  Component Value Date   HGBA1C 5.1 10/21/2015   MPG 100 10/21/2015   No results found for: PROLACTIN Lab Results  Component Value Date   CHOL 142 10/31/2017   TRIG 108 10/31/2017   HDL 52 10/31/2017   CHOLHDL 2.7 10/31/2017   VLDL 22 05/10/2016   LDLCALC 68 10/31/2017   LDLCALC 69 05/10/2016   No results found for: TSH  Therapeutic Level Labs: No results found for: LITHIUM No results found for: VALPROATE No components found for:  CBMZ  Current Medications: Current Outpatient Medications  Medication Sig Dispense Refill   amLODipine (NORVASC) 2.5 MG tablet TAKE 1 TABLET BY MOUTH EVERY DAY 90 tablet 3   Ascorbic Acid (VITAMIN C) 1000 MG tablet Take  1,000 mg by mouth every evening.     aspirin 325 MG EC tablet Take 1 tablet (325 mg total) by mouth daily. 30 tablet 1   atorvastatin (LIPITOR) 10 MG tablet TAKE 1 TABLET (10 MG TOTAL) BY MOUTH DAILY AT 6 PM. 90 tablet 3   busPIRone (BUSPAR) 7.5 MG tablet Take 7.5 mg by mouth 2 (two) times daily.     Calcium Carbonate-Vitamin D (CALCIUM + D PO) Take 1 tablet by mouth every evening.      esomeprazole (NEXIUM) 20 MG capsule Take 20 mg by mouth daily at 12 noon.     estrogens, conjugated, (  PREMARIN) 0.3 MG tablet Take 0.625 mg by mouth daily. Take daily for 21 days then do not take for 7 days.      hydrOXYzine (ATARAX/VISTARIL) 10 MG tablet Take 10 mg by mouth every 6 (six) hours as needed.     Multiple Vitamin (MULTIVITAMIN PO) Take by mouth.       Nebivolol HCl (BYSTOLIC) 20 MG TABS Take 1 tablet (20 mg total) by mouth daily.     promethazine (PHENERGAN) 25 MG suppository Place 1 suppository (25 mg total) rectally every 6 (six) hours as needed for nausea. 12 suppository 3   SUMAtriptan (IMITREX) 100 MG tablet TAKE 1 TAB BY MOUTH EVERY 2 HOURS AS NEEDED FOR MIGRAINE MAY REPEAT IN 2 HOURS IF PERSISTS OR RECURS 18 tablet 11   No current facility-administered medications for this visit.    Medication Side Effects: none  Orders placed this visit:  No orders of the defined types were placed in this encounter.   Psychiatric Specialty Exam:  Review of Systems  Blood pressure 109/63, pulse 87, height 5\' 8"  (1.727 m), weight 182 lb (82.6 kg).Body mass index is 27.67 kg/m.  General Appearance: Casual, Neat and Well Groomed  Eye Contact:  Good  Speech:  Clear and Coherent and Normal Rate  Volume:  Normal  Mood:  Euthymic  Affect:  Appropriate and Congruent  Thought Process:  Coherent and Descriptions of Associations: Intact  Orientation:  Full (Time, Place, and Person)  Thought Content: Logical   Suicidal Thoughts:  No  Homicidal Thoughts:  No  Memory:  WNL  Judgement:  Good   Insight:  Good  Psychomotor Activity:  Normal  Concentration:  Concentration: Good  Recall:  Good  Fund of Knowledge: Good  Language: Good  Assets:  Communication Skills Desire for Improvement Financial Resources/Insurance Housing Intimacy Leisure Time Physical Health Resilience Social Support Talents/Skills Transportation Vocational/Educational  ADL's:  Intact  Cognition: WNL  Prognosis:  Good   Screenings: MDQ  Receiving Psychotherapy: Yes   Treatment Plan/Recommendations:   Plan:  PDMP reviewed  1. Buspar 7.5mg  BID 2. Hydroxyzine 10mg  BID   Read and reviewed note with patient for accuracy.   RTC as needed.  Patient advised to contact office with any questions, adverse effects, or acute worsening in signs and symptoms.      Aloha Gell, NP

## 2020-05-01 NOTE — Progress Notes (Signed)
Patient to arrive at [facility] on 05/05/2020. History and medications reviewed. All pre-procedure instructions given. NPO after MN Sunday, Norvasc, Bystolic, Nexium and Buspar with sip of water morning of procedure.Driver secured.

## 2020-05-05 ENCOUNTER — Encounter (HOSPITAL_BASED_OUTPATIENT_CLINIC_OR_DEPARTMENT_OTHER)
Admission: RE | Disposition: A | Discharge status: Home / Self Care | Payer: Self-pay | Source: Home / Self Care | Attending: Urology

## 2020-05-05 ENCOUNTER — Ambulatory Visit (HOSPITAL_BASED_OUTPATIENT_CLINIC_OR_DEPARTMENT_OTHER)
Admission: RE | Admit: 2020-05-05 | Discharge: 2020-05-05 | Disposition: A | Discharge status: Home / Self Care | Payer: Commercial Managed Care - PPO | Attending: Urology | Admitting: Urology

## 2020-05-05 ENCOUNTER — Ambulatory Visit (HOSPITAL_COMMUNITY): Payer: Commercial Managed Care - PPO

## 2020-05-05 ENCOUNTER — Other Ambulatory Visit: Payer: Self-pay | Admitting: Urology

## 2020-05-05 ENCOUNTER — Other Ambulatory Visit: Payer: Self-pay

## 2020-05-05 ENCOUNTER — Encounter (HOSPITAL_BASED_OUTPATIENT_CLINIC_OR_DEPARTMENT_OTHER): Payer: Self-pay | Admitting: Urology

## 2020-05-05 DIAGNOSIS — N200 Calculus of kidney: Secondary | ICD-10-CM | POA: Diagnosis not present

## 2020-05-05 DIAGNOSIS — N201 Calculus of ureter: Secondary | ICD-10-CM

## 2020-05-05 DIAGNOSIS — Z5329 Procedure and treatment not carried out because of patient's decision for other reasons: Secondary | ICD-10-CM | POA: Insufficient documentation

## 2020-05-05 HISTORY — PX: EXTRACORPOREAL SHOCK WAVE LITHOTRIPSY: SHX1557

## 2020-05-05 HISTORY — DX: Nausea with vomiting, unspecified: Z98.890

## 2020-05-05 HISTORY — DX: Nausea with vomiting, unspecified: R11.2

## 2020-05-05 SURGERY — LITHOTRIPSY, ESWL
Anesthesia: LOCAL | Laterality: Right

## 2020-05-05 MED ORDER — DIPHENHYDRAMINE HCL 25 MG PO CAPS
ORAL_CAPSULE | ORAL | Status: AC
  Filled 2020-05-05: qty 1

## 2020-05-05 MED ORDER — DIAZEPAM 5 MG PO TABS
10.0000 mg | ORAL_TABLET | ORAL | Status: AC
  Administered 2020-05-05: 10 mg via ORAL

## 2020-05-05 MED ORDER — SODIUM CHLORIDE 0.9 % IV SOLN: INTRAVENOUS | Status: DC

## 2020-05-05 MED ORDER — CIPROFLOXACIN HCL 500 MG PO TABS
500.0000 mg | ORAL_TABLET | ORAL | Status: AC
  Administered 2020-05-05: 500 mg via ORAL

## 2020-05-05 MED ORDER — DIAZEPAM 5 MG PO TABS
ORAL_TABLET | ORAL | Status: AC
  Filled 2020-05-05: qty 2

## 2020-05-05 MED ORDER — CIPROFLOXACIN HCL 500 MG PO TABS
ORAL_TABLET | ORAL | Status: AC
  Filled 2020-05-05: qty 1

## 2020-05-05 MED ORDER — DIPHENHYDRAMINE HCL 25 MG PO CAPS
25.0000 mg | ORAL_CAPSULE | ORAL | Status: AC
  Administered 2020-05-05: 25 mg via ORAL

## 2020-05-05 NOTE — Discharge Instructions (Signed)
See [facility] discharge instructions in chart.  

## 2020-05-05 NOTE — Progress Notes (Signed)
Patient back from [place]. Malfunction on the truck.  Technician will be coming at 1230.  Patient can potentially go at 1 if it is fixed if not she will go at 4.  Pt is in room 1 pre op.  Pt is ok with situation.  Family is aware.

## 2020-05-05 NOTE — Progress Notes (Signed)
Discussed with patient that Dr Claudia Desanctis would be unavailable to perform her procedure until approximately 1630 today according to the RN on the lithotripsy mobile unit. Patient has decided to reschedule for 05/08/2020. [facility] notified but has not returned call yet. Patient instructed to come to [facility] on 05/07/2020 after work for covid test.

## 2020-05-05 NOTE — H&P (Signed)
Please see HP scanned into chart  

## 2020-05-05 NOTE — Progress Notes (Signed)
Patient rescheduled for 05/08/2020. To arrive at 1315. Pre-procedure instructions reviewed. NPO after MN Wednesday, except for clear liquids until 1100am. Driver secured.

## 2020-05-06 ENCOUNTER — Encounter (HOSPITAL_BASED_OUTPATIENT_CLINIC_OR_DEPARTMENT_OTHER): Payer: Self-pay | Admitting: Urology

## 2020-05-07 DIAGNOSIS — Z20822 Contact with and (suspected) exposure to covid-19: Secondary | ICD-10-CM | POA: Diagnosis not present

## 2020-05-07 DIAGNOSIS — I10 Essential (primary) hypertension: Secondary | ICD-10-CM | POA: Diagnosis not present

## 2020-05-07 DIAGNOSIS — Z7982 Long term (current) use of aspirin: Secondary | ICD-10-CM | POA: Diagnosis not present

## 2020-05-07 DIAGNOSIS — G43909 Migraine, unspecified, not intractable, without status migrainosus: Secondary | ICD-10-CM | POA: Diagnosis not present

## 2020-05-07 DIAGNOSIS — Z87440 Personal history of urinary (tract) infections: Secondary | ICD-10-CM | POA: Diagnosis not present

## 2020-05-07 DIAGNOSIS — Z79890 Hormone replacement therapy (postmenopausal): Secondary | ICD-10-CM | POA: Diagnosis not present

## 2020-05-07 DIAGNOSIS — Z79899 Other long term (current) drug therapy: Secondary | ICD-10-CM | POA: Diagnosis not present

## 2020-05-07 DIAGNOSIS — K219 Gastro-esophageal reflux disease without esophagitis: Secondary | ICD-10-CM | POA: Diagnosis not present

## 2020-05-07 DIAGNOSIS — N201 Calculus of ureter: Secondary | ICD-10-CM | POA: Diagnosis present

## 2020-05-07 DIAGNOSIS — Z87442 Personal history of urinary calculi: Secondary | ICD-10-CM | POA: Diagnosis not present

## 2020-05-07 LAB — SARS CORONAVIRUS 2 BY RT PCR (HOSPITAL ORDER, PERFORMED IN ~~LOC~~ HOSPITAL LAB): SARS Coronavirus 2: NEGATIVE

## 2020-05-07 NOTE — H&P (Signed)
CC: right lower groin pain, dysuria, chills   HPI: 64 year old female who has a past medical history of UTIs, and nephrolithiasis. She presents today with 6 weeks of dysuria, nausea, intermittent right flank discomfort and chills. She has been treated for UTI 3 times over the past 6 weeks and subsequently developed another infection. She reports fevers ranging from 99-100.1 home. The fevers have been relieved with Tylenol. Currently, she is suffering from severe anxiety related to a life events that occurred at work and she has been experiencing a lot of nausea and anorexia from this. She is unsure if her nausea and anorexia are related to her symptoms occurring with her urinary tract at this time or there are related more to her anxiety. She denies gross hematuria, inability to keep fluids or food down and passage of stone material. She was diagnosed with a 6 mm right obstructing ureteral stone yesterday in office and given 1 g of IM Rocephin. She was also started on Cipro 250 mg. Today, she is feeling much better and denies interval fevers. She is tolerating p.o. intake and her urinalysis is much improved. The     ALLERGIES: Codeine Derivatives Macrobid Morphine Derivatives Sulfa Drugs    MEDICATIONS: Aspirin 325 mg tablet  Cipro 500 mg tablet 1 tablet PO BID  Amlodipine Besylate 2.5 mg tablet  Atorvastatin Calcium 10 mg tablet  Bystolic 10 mg tablet Oral  Calcium + D  Multiple Vitamin TABS Oral  Nexium 40 mg capsule,delayed release Oral  Premarin 1.25 mg tablet 1/4 tablet PO Daily  Promethazine Hcl 25 mg tablet  Sumatriptan Succinate 100 mg tablet  Vitamin C     GU PSH: Hysterectomy Unilat SO - 2008       PSH Notes: Tubal Ligation, Hysterectomy, Foot Surgery   NON-GU PSH: Cataract surgery, Bilateral Tubal Ligation - 2008     GU PMH: Ureteral calculus (Stable) - 04/24/2020, Distal Ureteral Stone On The Left, - 2014 Acute Cystitis/UTI (Worsening), It looks like she has a  recurrent infection - 12/01/2018, - 2019 Stress Incontinence - 2019 Urinary Tract Inf, Unspec site, Urinary tract infection - 2016, Pyuria, - 2014 History of urolithiasis, Nephrolithiasis - 2014 Other microscopic hematuria, Microscopic hematuria - 2014 Renal calculus, Kidney stone on left side - 2014      PMH Notes:  2006-09-27 10:32:34 - Note: Chronic Peptic Ulcer  2012-05-24 17:15:22 - Note: Nephrolithiasis Of Both Kidneys  2006-09-27 10:32:34 - Note: Cervical Cancer   NON-GU PMH: Encounter for general adult medical examination without abnormal findings, Encounter for preventive health examination - 2016 Diverticulosis, Diverticulosis - 2014 Nausea, Nausea - 2014 Personal history of other diseases of the digestive system, History of esophageal reflux - 2014 Personal history of other diseases of the nervous system and sense organs, History of migraine headaches - 2014 Personal history of other specified conditions, History of heartburn - 2014 Arthritis    FAMILY HISTORY: 3 daughters - Daughter Acute Myocardial Infarction - Mother, Father Death In The Family Father - Father Death In The Family Mother - Mother Diverticulosis Of Intestine - Sister, Brother   SOCIAL HISTORY: Marital Status: Married Preferred Language: English; Ethnicity: Not Hispanic Or Latino; Race: White Current Smoking Status: Patient has never smoked.   Tobacco Use Assessment Completed: Used Tobacco in last 30 days? Does drink.  Drinks 1 caffeinated drink per day.     Notes: Caffeine Use, Alcohol Use, Number of children, Never A Smoker, Occupation:, Marital History - Currently Married   REVIEW OF SYSTEMS:  GU Review Female:   Patient reports frequent urination, get up at night to urinate, leakage of urine, and stream starts and stops. Patient denies hard to postpone urination, burning /pain with urination, trouble starting your stream, have to strain to urinate, and being pregnant.  Gastrointestinal (Upper):    Patient reports nausea. Patient denies vomiting and indigestion/ heartburn.  Gastrointestinal (Lower):   Patient denies diarrhea and constipation.  Constitutional:   Patient reports fatigue. Patient denies night sweats and weight loss.  Skin:   Patient denies skin rash/ lesion and itching.  Eyes:   Patient denies blurred vision and double vision.  Ears/ Nose/ Throat:   Patient denies sore throat and sinus problems.  Hematologic/Lymphatic:   Patient denies swollen glands and easy bruising.  Cardiovascular:   Patient denies leg swelling and chest pains.  Respiratory:   Patient denies cough and shortness of breath.  Endocrine:   Patient denies excessive thirst.  Musculoskeletal:   Patient denies back pain and joint pain.  Neurological:   Patient denies headaches and dizziness.  Psychologic:   Patient denies depression and anxiety.   VITAL SIGNS:      04/25/2020 07:48 AM  Weight 180 lb / 81.65 kg  Height 68.5 in / 173.99 cm  BP 117/76 mmHg  Pulse 71 /min  Temperature 97.1 F / 36.1 C  BMI 27.0 kg/m   GU PHYSICAL EXAMINATION:      Notes: Right-sided CVA tenderness   MULTI-SYSTEM PHYSICAL EXAMINATION:    Constitutional: Well-nourished. No physical deformities. Normally developed. Good grooming.  Cardiovascular: Normal temperature, normal extremity pulses, no swelling, no varicosities.  Skin: No paleness, no jaundice, no cyanosis. No lesion, no ulcer, no rash.  Neurologic / Psychiatric: Oriented to time, oriented to place, oriented to person. No depression, no anxiety, no agitation.   Gastrointestinal: No mass, no tenderness, no rigidity, non obese abdomen.  Musculoskeletal: Normal gait and station of head and neck.     Complexity of Data:  Source Of History:  Patient, Family/Caregiver, Medical Record Summary  Lab Test Review:   BUN/Creatinine  Records Review:   Previous Doctor Records, Previous Hospital Records, Previous Patient Records  Urine Test Review:   Urinalysis, Urine  Culture  X-Ray Review: C.T. Abdomen/Pelvis: Reviewed Films. Reviewed Report. Discussed With Patient.     04/25/20  Urinalysis  Urine Appearance Slightly Cloudy   Urine Color Orange   Urine Glucose Invalid mg/dL  Urine Bilirubin Invalid mg/dL  Urine Ketones Invalid mg/dL  Urine Specific Gravity Invalid   Urine Blood Invalid ery/uL  Urine pH Invalid   Urine Protein Invalid mg/dL  Urine Urobilinogen Invalid mg/dL  Urine Nitrites Invalid   Urine Leukocyte Esterase Invalid leu/uL  Urine WBC/hpf 10 - 20/hpf   Urine RBC/hpf NS (Not Seen)   Urine Epithelial Cells NS (Not Seen)   Urine Bacteria Rare (0-9/hpf)   Urine Mucous Not Present   Urine Yeast NS (Not Seen)   Urine Trichomonas Not Present   Urine Cystals NS (Not Seen)   Urine Casts NS (Not Seen)   Urine Sperm Not Present    PROCEDURES:         KUB - 81191  A single view of the abdomen is obtained. The bilateral renal shadows are well visualized. There is a approximate 6 mm opacity within the pelvic inlet along the expected course of the right ureter congruent with CT exam report.      . Patient confirmed No Neulasta OnPro Device.  Urinalysis w/Scope Dipstick Dipstick Cont'd Micro  Color: Orange Bilirubin: Invalid mg/dL WBC/hpf: 10 - 20/hpf  Appearance: Slightly Cloudy Ketones: Invalid mg/dL RBC/hpf: NS (Not Seen)  Specific Gravity: Invalid Blood: Invalid ery/uL Bacteria: Rare (0-9/hpf)  pH: Invalid Protein: Invalid mg/dL Cystals: NS (Not Seen)  Glucose: Invalid mg/dL Urobilinogen: Invalid mg/dL Casts: NS (Not Seen)    Nitrites: Invalid Trichomonas: Not Present    Leukocyte Esterase: Invalid leu/uL Mucous: Not Present      Epithelial Cells: NS (Not Seen)      Yeast: NS (Not Seen)      Sperm: Not Present    Notes: unspun microscopic performed          Ceftriaxone 1g - [id number], [id number] Injection site was cleaned using an alcohol swab. Injection was given (IM) using standard technique. Band aid was applied.     Qty: 1 Adm. By: Raymon Mutton  Unit: gram Lot No [id number]  Route: IM Exp. Date 05/19/2021  Freq: None Mfgr.:   Site: None   ASSESSMENT:      ICD-10 Details  1 GU:   Ureteral calculus - N20.1 Right, Acute, Systemic Symptoms   PLAN:            Medications New Meds: Uroxatral 10 mg tablet, extended release 24 hr 1 tablet PO Q HS PRN Take with food in the evening  #14  0 Refill(s)  Hydrocodone-Acetaminophen 5 mg-325 mg tablet 1 tablet PO Q 6 H PRN for severe pain  #20  0 Refill(s)            Orders X-Rays: KUB          Schedule Labs: 1 Week - BUN/Creatinine  Procedure: 04/25/2020 at [facility] - [phone] - Ceftriaxone 1g (Injection, Ceftriaxone Sodium, Per 250 Mg) - M5784, [id number]          Document Letter(s):  Created for Patient: Clinical Summary         Notes:   Urinalysis is improving today. Advised another g of IM Rocephin today as well as for her to continue her Cipro. I discussed options related to stone management and at this time I do not feel that emergent stent placement is needed as her fevers have stopped and she is feeling much better. She would like to avoid emergent stent placement as well. However, I did advised if she develops a fever over the weekend or any worsening symptomatology she proceeds to the emergency department and she was in agreement with this. We discussed medical expulsive therapy as well as a ESWL and ureteroscopy. She would like to avoid a ureteroscopy if possible she would prefer to attempt a ESWL. I did discuss with her that the potential risk of ESWL including failure of procedure especially with a distal stone. She verbalized understanding I discussed this with on-call physician as well who agreed it was okay to proceed with ESWL for next available. A green she was handed to the scheduler today. RX sent to pharmacy for pain.         Next Appointment:      Next Appointment: 04/28/2020 08:30 AM    Appointment Type:  Laboratory Appointment    Location: [facility] [phone]    Provider: Lab LAB    Reason for Visit: 1 week labs     The shock head broke on 05/05/20 so the treatment was aborted and she returns for completion.

## 2020-05-08 ENCOUNTER — Encounter (HOSPITAL_BASED_OUTPATIENT_CLINIC_OR_DEPARTMENT_OTHER): Payer: Self-pay | Admitting: Urology

## 2020-05-08 ENCOUNTER — Encounter (HOSPITAL_BASED_OUTPATIENT_CLINIC_OR_DEPARTMENT_OTHER)
Admission: RE | Disposition: A | Discharge status: Home / Self Care | Payer: Self-pay | Source: Home / Self Care | Attending: Urology

## 2020-05-08 ENCOUNTER — Ambulatory Visit (HOSPITAL_COMMUNITY): Payer: Commercial Managed Care - PPO

## 2020-05-08 ENCOUNTER — Ambulatory Visit (HOSPITAL_BASED_OUTPATIENT_CLINIC_OR_DEPARTMENT_OTHER)
Admission: RE | Admit: 2020-05-08 | Discharge: 2020-05-08 | Disposition: A | Discharge status: Home / Self Care | Payer: Commercial Managed Care - PPO | Attending: Urology | Admitting: Urology

## 2020-05-08 DIAGNOSIS — Z87442 Personal history of urinary calculi: Secondary | ICD-10-CM | POA: Insufficient documentation

## 2020-05-08 DIAGNOSIS — Z87440 Personal history of urinary (tract) infections: Secondary | ICD-10-CM | POA: Insufficient documentation

## 2020-05-08 DIAGNOSIS — G43909 Migraine, unspecified, not intractable, without status migrainosus: Secondary | ICD-10-CM | POA: Insufficient documentation

## 2020-05-08 DIAGNOSIS — Z79890 Hormone replacement therapy (postmenopausal): Secondary | ICD-10-CM | POA: Insufficient documentation

## 2020-05-08 DIAGNOSIS — N201 Calculus of ureter: Secondary | ICD-10-CM | POA: Diagnosis not present

## 2020-05-08 DIAGNOSIS — K219 Gastro-esophageal reflux disease without esophagitis: Secondary | ICD-10-CM | POA: Insufficient documentation

## 2020-05-08 DIAGNOSIS — I10 Essential (primary) hypertension: Secondary | ICD-10-CM | POA: Insufficient documentation

## 2020-05-08 DIAGNOSIS — Z7982 Long term (current) use of aspirin: Secondary | ICD-10-CM | POA: Insufficient documentation

## 2020-05-08 DIAGNOSIS — Z20822 Contact with and (suspected) exposure to covid-19: Secondary | ICD-10-CM | POA: Insufficient documentation

## 2020-05-08 DIAGNOSIS — Z79899 Other long term (current) drug therapy: Secondary | ICD-10-CM | POA: Insufficient documentation

## 2020-05-08 HISTORY — PX: EXTRACORPOREAL SHOCK WAVE LITHOTRIPSY: SHX1557

## 2020-05-08 SURGERY — LITHOTRIPSY, ESWL
Anesthesia: LOCAL | Laterality: Right

## 2020-05-08 MED ORDER — CIPROFLOXACIN HCL 500 MG PO TABS
ORAL_TABLET | ORAL | Status: AC
  Filled 2020-05-08: qty 1

## 2020-05-08 MED ORDER — DIPHENHYDRAMINE HCL 25 MG PO CAPS
ORAL_CAPSULE | ORAL | Status: AC
  Filled 2020-05-08: qty 1

## 2020-05-08 MED ORDER — SODIUM CHLORIDE 0.9% FLUSH: 3.0000 mL | Freq: Two times a day (BID) | INTRAVENOUS | Status: DC

## 2020-05-08 MED ORDER — DIPHENHYDRAMINE HCL 25 MG PO CAPS
25.0000 mg | ORAL_CAPSULE | ORAL | Status: AC
  Administered 2020-05-08: 25 mg via ORAL

## 2020-05-08 MED ORDER — CIPROFLOXACIN HCL 500 MG PO TABS
500.0000 mg | ORAL_TABLET | ORAL | Status: AC
  Administered 2020-05-08: 500 mg via ORAL

## 2020-05-08 MED ORDER — DIAZEPAM 5 MG PO TABS
ORAL_TABLET | ORAL | Status: AC
  Filled 2020-05-08: qty 2

## 2020-05-08 MED ORDER — SODIUM CHLORIDE 0.9 % IV SOLN: INTRAVENOUS | Status: DC

## 2020-05-08 MED ORDER — DIAZEPAM 5 MG PO TABS
10.0000 mg | ORAL_TABLET | ORAL | Status: AC
  Administered 2020-05-08: 10 mg via ORAL

## 2020-05-08 NOTE — Discharge Instructions (Signed)
Lithotripsy, Care After This sheet gives you information about how to care for yourself after your procedure. Your health care provider may also give you more specific instructions. If you have problems or questions, contact your health care provider. What can I expect after the procedure? After the procedure, it is common to have:  Some blood in your urine. This should only last for a few days.  Soreness in your back, sides, or upper abdomen for a few days.  Blotches or bruises on your back where the pressure wave entered the skin.  Pain, discomfort, or nausea when pieces (fragments) of the kidney stone move through the tube that carries urine from the kidney to the bladder (ureter). Stone fragments may pass soon after the procedure, but they may continue to pass for up to 4-8 weeks. ? If you have severe pain or nausea, contact your health care provider. This may be caused by a large stone that was not broken up, and this may mean that you need more treatment.  Some pain or discomfort during urination.  Some pain or discomfort in the lower abdomen or (in men) at the base of the penis. Follow these instructions at home: Medicines  Take over-the-counter and prescription medicines only as told by your health care provider.  If you were prescribed an antibiotic medicine, take it as told by your health care provider. Do not stop taking the antibiotic even if you start to feel better.  Do not drive for 24 hours if you were given a medicine to help you relax (sedative).  Do not drive or use heavy machinery while taking prescription pain medicine. Eating and drinking      Drink enough water and fluids to keep your urine clear or pale yellow. This helps any remaining pieces of the stone to pass. It can also help prevent new stones from forming.  Eat plenty of fresh fruits and vegetables.  Follow instructions from your health care provider about eating and drinking restrictions. You may be  instructed: ? To reduce how much salt (sodium) you eat or drink. Check ingredients and nutrition facts on packaged foods and beverages. ? To reduce how much meat you eat.  Eat the recommended amount of calcium for your age and gender. Ask your health care provider how much calcium you should have. General instructions  Get plenty of rest.  Most people can resume normal activities 1-2 days after the procedure. Ask your health care provider what activities are safe for you.  Your health care provider may direct you to lie in a certain position (postural drainage) and tap firmly (percuss) over your kidney area to help stone fragments pass. Follow instructions as told by your health care provider.  If directed, strain all urine through the strainer that was provided by your health care provider. ? Keep all fragments for your health care provider to see. Any stones that are found may be sent to a medical lab for examination. The stone may be as small as a grain of salt.  Keep all follow-up visits as told by your health care provider. This is important. Contact a health care provider if:  You have pain that is severe or does not get better with medicine.  You have nausea that is severe or does not go away.  You have blood in your urine longer than your health care provider told you to expect.  You have more blood in your urine.  You have pain during urination that does   not go away.  You urinate more frequently than usual and this does not go away.  You develop a rash or any other possible signs of an allergic reaction. Get help right away if:  You have severe pain in your back, sides, or upper abdomen.  You have severe pain while urinating.  Your urine is very dark red.  You have blood in your stool (feces).  You cannot pass any urine at all.  You feel a strong urge to urinate after emptying your bladder.  You have a fever or chills.  You develop shortness of breath,  difficulty breathing, or chest pain.  You have severe nausea that leads to persistent vomiting.  You faint. Summary  After this procedure, it is common to have some pain, discomfort, or nausea when pieces (fragments) of the kidney stone move through the tube that carries urine from the kidney to the bladder (ureter). If this pain or nausea is severe, however, you should contact your health care provider.  Most people can resume normal activities 1-2 days after the procedure. Ask your health care provider what activities are safe for you.  Drink enough water and fluids to keep your urine clear or pale yellow. This helps any remaining pieces of the stone to pass, and it can help prevent new stones from forming.  If directed, strain your urine and keep all fragments for your health care provider to see. Fragments or stones may be as small as a grain of salt.  Get help right away if you have severe pain in your back, sides, or upper abdomen or have severe pain while urinating. This information is not intended to replace advice given to you by your health care provider. Make sure you discuss any questions you have with your health care provider. Document Revised: 10/16/2018 Document Reviewed: 05/26/2016 Elsevier Patient Education  2020 Elsevier Inc.  

## 2020-05-08 NOTE — Interval H&P Note (Signed)
History and Physical Interval Note:  05/08/2020 3:04 PM  Raven [name]  has presented today for surgery, with the diagnosis of RIGHT URETERAL STONE.  The various methods of treatment have been discussed with the patient and family. After consideration of risks, benefits and other options for treatment, the patient has consented to  Procedure(s): EXTRACORPOREAL SHOCK WAVE LITHOTRIPSY (ESWL) (Right) as a surgical intervention.  The patient's history has been reviewed, patient examined, no change in status, stable for surgery.  I have reviewed the patient's chart and labs.  Questions were answered to the patient's satisfaction.     [name]

## 2020-05-09 ENCOUNTER — Encounter (HOSPITAL_BASED_OUTPATIENT_CLINIC_OR_DEPARTMENT_OTHER): Payer: Self-pay | Admitting: Urology

## 2020-05-16 ENCOUNTER — Encounter (HOSPITAL_BASED_OUTPATIENT_CLINIC_OR_DEPARTMENT_OTHER): Payer: Self-pay | Admitting: Urology

## 2020-09-12 ENCOUNTER — Encounter: Payer: Self-pay | Admitting: Adult Health

## 2020-09-12 ENCOUNTER — Ambulatory Visit (INDEPENDENT_AMBULATORY_CARE_PROVIDER_SITE_OTHER): Payer: Commercial Managed Care - PPO | Admitting: Adult Health

## 2020-09-12 ENCOUNTER — Other Ambulatory Visit: Payer: Self-pay

## 2020-09-12 DIAGNOSIS — F331 Major depressive disorder, recurrent, moderate: Secondary | ICD-10-CM

## 2020-09-12 DIAGNOSIS — F411 Generalized anxiety disorder: Secondary | ICD-10-CM | POA: Diagnosis not present

## 2020-09-12 NOTE — Progress Notes (Signed)
Raven [name] [id number] 27-Oct-1955 65 y.o.  Subjective:   Patient ID:  Raven [name] is a 65 y.o. (DOB [birth year]) female.  Chief Complaint: No chief complaint on file.   HPI [name] presents to the office today for follow-up of MDD and GAD.   Describes mood today as "ok". Pleasant. Mood symptoms - denies depression and irritability. Feels anxious. Felt like anxiety Had leveled out to a 4 out of 10 until recently. Stating "I feel like I'm slipping". Getting "startled". Having "sensations" of hot flashes without the heat. Feels "uncomfortable" - difficulties with focus and concentration - disrupted sleep. Saw PCP and Buspar was increased from 7.5mg  BID to 10mg  BID - has not started new dosing yet. Feeling "sick" after morning dose.  Feels like medications are helping. Recent TSH was elevated and she was referred to Endocrinologist. Improved interest and motivation. Taking medications as prescribed.  Energy levels stable. Active, does not have a regular exercise routine.  Enjoys some usual interests and activities. Married. Lives with husband of 70 years and 2 dogs. Has 3 daughters and 1 son. Spending time with family. Appetite adequate - better. Weight gain - 5 pounds. Sleeps well most nights. Averages 6 hours.   Focus and concentration stable. Completing tasks. Managing aspects of household. 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  Denies AH or VH. Therapist - Bartolo Darter  Previous medication trials: Buspar, Hydroxyzine     Review of Systems:  Review of Systems  Musculoskeletal: Negative for gait problem.  Neurological: Negative for tremors.  Psychiatric/Behavioral:       Please refer to HPI    Medications: I have reviewed the patient's current medications.  Current Outpatient Medications  Medication Sig Dispense Refill  . amLODipine (NORVASC) 2.5 MG tablet TAKE 1 TABLET BY MOUTH EVERY DAY 90 tablet 3  . Ascorbic Acid (VITAMIN C) 1000 MG tablet  Take 1,000 mg by mouth every evening.    Marland Kitchen aspirin 325 MG EC tablet Take 1 tablet (325 mg total) by mouth daily. 30 tablet 1  . atorvastatin (LIPITOR) 10 MG tablet TAKE 1 TABLET (10 MG TOTAL) BY MOUTH DAILY AT 6 PM. 90 tablet 3  . busPIRone (BUSPAR) 7.5 MG tablet Take 7.5 mg by mouth 2 (two) times daily.    . Calcium Carbonate-Vitamin D (CALCIUM + D PO) Take 1 tablet by mouth every evening.     . ciprofloxacin (CIPRO) 500 MG tablet Take 500 mg by mouth 2 (two) times daily.    Marland Kitchen esomeprazole (NEXIUM) 20 MG capsule Take 20 mg by mouth daily at 12 noon.    . estrogens, conjugated, (PREMARIN) 0.3 MG tablet Take 0.625 mg by mouth daily. Take daily for 21 days then do not take for 7 days.     . hydrOXYzine (ATARAX/VISTARIL) 10 MG tablet Take 10 mg by mouth every 6 (six) hours as needed.    . Multiple Vitamin (MULTIVITAMIN PO) Take by mouth.      . Nebivolol HCl (BYSTOLIC) 20 MG TABS Take 1 tablet (20 mg total) by mouth daily.    . promethazine (PHENERGAN) 25 MG suppository Place 1 suppository (25 mg total) rectally every 6 (six) hours as needed for nausea. 12 suppository 3  . SUMAtriptan (IMITREX) 100 MG tablet TAKE 1 TAB BY MOUTH EVERY 2 HOURS AS NEEDED FOR MIGRAINE MAY REPEAT IN 2 HOURS IF PERSISTS OR RECURS 18 tablet 11   No current facility-administered medications for this visit.    Medication Side  Effects: None  Allergies:  Allergies  Allergen Reactions  . Codeine Nausea Only, Other (See Comments) and Nausea And Vomiting    "not well tolerated"  . Morphine Nausea Only and Other (See Comments)    "not well tolerated"   . Morphine And Related Nausea Only and Other (See Comments)    "not well tolerated"  . Sulfa Antibiotics Itching, Hives and Rash    Past Medical History:  Diagnosis Date  . Abnormal Pap smear 08/2005  . Cancer (HCC)    cervical  . Diverticulosis   . History of migraines   . Hypertension   . Kidney stones 2012  . Menopause   . Migraine   . PFO (patent foramen  ovale)   . PONV (postoperative nausea and vomiting)   . Seasonal allergies   . Stroke [facility])     Family History  Problem Relation Age of Onset  . Heart disease Mother        heart attack  . Heart disease Father        heart attack  . Hypertension Father     Social History   Socioeconomic History  . Marital status: Married    Spouse name: Not on file  . Number of children: Not on file  . Years of education: Not on file  . Highest education level: Not on file  Occupational History  . Not on file  Tobacco Use  . Smoking status: Never Smoker  . Smokeless tobacco: Never Used  Vaping Use  . Vaping Use: Never used  Substance and Sexual Activity  . Alcohol use: Yes    Alcohol/week: 1.0 standard drink    Types: 1 Glasses of wine per week    Comment: rare / wine  . Drug use: No  . Sexual activity: Not on file  Other Topics Concern  . Not on file  Social History Narrative  . Not on file   Social Determinants of Health   Financial Resource Strain: Not on file  Food Insecurity: Not on file  Transportation Needs: Not on file  Physical Activity: Not on file  Stress: Not on file  Social Connections: Not on file  Intimate Partner Violence: Not on file    Past Medical History, Surgical history, Social history, and Family history were reviewed and updated as appropriate.   Please see review of systems for further details on the patient's review from today.   Objective:   Physical Exam:  There were no vitals taken for this visit.  Physical Exam Constitutional:      General: She is not in acute distress. Musculoskeletal:        General: No deformity.  Neurological:     Mental Status: She is alert and oriented to person, place, and time.     Coordination: Coordination normal.  Psychiatric:        Attention and Perception: Attention and perception normal. She does not perceive auditory or visual hallucinations.        Mood and Affect: Mood normal. Mood is not anxious  or depressed. Affect is not labile, blunt, angry or inappropriate.        Speech: Speech normal.        Behavior: Behavior normal.        Thought Content: Thought content normal. Thought content is not paranoid or delusional. Thought content does not include homicidal or suicidal ideation. Thought content does not include homicidal or suicidal plan.        Cognition and  Memory: Cognition and memory normal.        Judgment: Judgment normal.     Comments: Insight intact     Lab Review:     Component Value Date/Time   NA 141 10/31/2017 0937   K 3.8 10/31/2017 0937   CL 103 10/31/2017 0937   CO2 23 10/31/2017 0937   GLUCOSE 76 10/31/2017 0937   GLUCOSE 85 10/21/2015 0343   BUN 13 10/31/2017 0937   CREATININE 1.13 (H) 10/31/2017 0937   CALCIUM 9.4 10/31/2017 0937   PROT 6.7 10/31/2017 0937   ALBUMIN 4.3 10/31/2017 0937   AST 18 10/31/2017 0937   ALT 16 10/31/2017 0937   ALKPHOS 99 10/31/2017 0937   BILITOT 0.6 10/31/2017 0937   GFRNONAA 53 (L) 10/31/2017 0937   GFRAA 61 10/31/2017 0937       Component Value Date/Time   WBC 5.9 10/21/2015 0343   RBC 4.32 10/21/2015 0343   HGB 13.0 10/21/2015 0343   HCT 39.4 10/21/2015 0343   PLT 164 10/21/2015 0343   MCV 91.2 10/21/2015 0343   MCH 30.1 10/21/2015 0343   MCHC 33.0 10/21/2015 0343   RDW 12.8 10/21/2015 0343   LYMPHSABS 2.0 10/21/2015 0343   MONOABS 0.5 10/21/2015 0343   EOSABS 0.1 10/21/2015 0343   BASOSABS 0.0 10/21/2015 0343    No results found for: POCLITH, LITHIUM   No results found for: PHENYTOIN, PHENOBARB, VALPROATE, CBMZ   .res Assessment: Plan:    Plan:  PDMP reviewed  1. Buspar 7.5mg  to 10mg  BID 2. Hydroxyzine 10mg  BID  Read and reviewed note with patient for accuracy.   RTC 3 months.  Patient advised to contact office with any questions, adverse effects, or acute worsening in signs and symptoms.   Diagnoses and all orders for this visit:  Major depressive disorder, recurrent episode, moderate  (HCC)  Generalized anxiety disorder     Please see After Visit Summary for patient specific instructions.  Future Appointments  Date Time Provider [place]  12/10/2020  8:00 AM Liston Thum, Berdie Ogren, NP [facility] None    No orders of the defined types were placed in this encounter.   -------------------------------

## 2020-10-20 ENCOUNTER — Other Ambulatory Visit: Payer: Self-pay | Admitting: Obstetrics and Gynecology

## 2020-10-20 DIAGNOSIS — Z1231 Encounter for screening mammogram for malignant neoplasm of breast: Secondary | ICD-10-CM

## 2020-11-11 ENCOUNTER — Other Ambulatory Visit: Payer: Self-pay

## 2020-11-11 ENCOUNTER — Ambulatory Visit
Admission: RE | Admit: 2020-11-11 | Discharge: 2020-11-11 | Disposition: A | Discharge status: Home / Self Care | Payer: Commercial Managed Care - PPO | Source: Ambulatory Visit

## 2020-11-11 DIAGNOSIS — Z1231 Encounter for screening mammogram for malignant neoplasm of breast: Secondary | ICD-10-CM

## 2020-12-07 ENCOUNTER — Other Ambulatory Visit: Payer: Self-pay | Admitting: Cardiovascular Disease

## 2020-12-08 NOTE — Telephone Encounter (Signed)
Rx(s) sent to pharmacy electronically.  

## 2020-12-09 ENCOUNTER — Ambulatory Visit: Payer: Commercial Managed Care - PPO | Admitting: Cardiovascular Disease

## 2020-12-09 ENCOUNTER — Other Ambulatory Visit: Payer: Self-pay

## 2020-12-09 ENCOUNTER — Encounter: Payer: Self-pay | Admitting: Cardiovascular Disease

## 2020-12-09 VITALS — BP 130/74 | HR 57 | Ht 68.0 in | Wt 194.6 lb

## 2020-12-09 DIAGNOSIS — I10 Essential (primary) hypertension: Secondary | ICD-10-CM

## 2020-12-09 DIAGNOSIS — E7800 Pure hypercholesterolemia, unspecified: Secondary | ICD-10-CM | POA: Diagnosis not present

## 2020-12-09 DIAGNOSIS — E663 Overweight: Secondary | ICD-10-CM | POA: Diagnosis not present

## 2020-12-09 DIAGNOSIS — Z8673 Personal history of transient ischemic attack (TIA), and cerebral infarction without residual deficits: Secondary | ICD-10-CM | POA: Diagnosis not present

## 2020-12-09 NOTE — Patient Instructions (Signed)

## 2020-12-09 NOTE — Progress Notes (Signed)
Patient ID: Raven [name], female   DOB: Jul 29, 1955, 65 y.o.   MRN: [id number]     Cardiology office note    Date:  12/10/2020   ID:  Raven [name], Raven [name] March 07, 1956, MRN [id number]  PCP:  Raven Morning, DO  Cardiologist:  Raven Klein, MD  Electrophysiologist:  None   Evaluation Performed:  Follow-Up Visit  Chief Complaint: History of cryptogenic stroke, hypertension, hyperlipidemia  History of Present Illness:    Raven [name] is a 65 y.o. female with history of unexplained stroke that occurred roughly 4 years ago (April 2017, MRI "..several small acute posterior left MCA territory cortical and white matter infarcts "), without current residual deficits.  She has hypertension and hypercholesterolemia.  Extensive work-up that has included 3 years of loop recorder monitoring (device now at end of service), CT angiogram of the head and neck, carotid duplex ultrasound and TEE did not reveal an etiology for the stroke.  She has not had any cardiovascular complaints since her last appointment.  For reasons that she has been unable to pinpoint she had tremendous problems with anxiety over the last year.  At one point she could not very to ever be without somebody close by.  This may be work-related.  She is getting much better and has responded well to treatment with buspirone.  This causes some transient sedation but she has learned to deal with that.  She will probably retire by this time next year.  Past Medical History:  Diagnosis Date  . Abnormal Pap smear 08/2005  . Cancer (HCC)    cervical  . Diverticulosis   . History of migraines   . Hypertension   . Kidney stones 2012  . Menopause   . Migraine   . PFO (patent foramen ovale)   . PONV (postoperative nausea and vomiting)   . Seasonal allergies   . Stroke [name])    Past Surgical History:  Procedure Laterality Date  . BUNIONECTOMY  1987  . EP IMPLANTABLE DEVICE N/A 10/22/2015   Procedure: Loop Recorder Insertion;   Surgeon: Will Raven Leeds, MD;  Location: [name];  Service: Cardiovascular;  Laterality: N/A;  . EXTRACORPOREAL SHOCK WAVE LITHOTRIPSY Right 05/08/2020   Procedure: EXTRACORPOREAL SHOCK WAVE LITHOTRIPSY (ESWL);  Surgeon: Raven Seal, MD;  Location: [name];  Service: Urology;  Laterality: Right;  . EXTRACORPOREAL SHOCK WAVE LITHOTRIPSY Right 05/05/2020   Procedure: EXTRACORPOREAL SHOCK WAVE LITHOTRIPSY (ESWL);  Surgeon: Raven Fries, MD;  Location: [name];  Service: Urology;  Laterality: Right;  . NM MYOCAR PERF WALL MOTION  02/28/2008   normal  . partial mastectomy  2008  . TEE WITHOUT CARDIOVERSION N/A 10/22/2015   Procedure: TRANSESOPHAGEAL ECHOCARDIOGRAM (TEE);  Surgeon: Raven Latch, MD;  Location: [name];  Service: Cardiovascular;  Laterality: N/A;  . TOTAL ABDOMINAL HYSTERECTOMY     cervical cancer  . TUBAL LIGATION  1988  . US ECHOCARDIOGRAPHY  03/21/2008   Trace MR,TR & PR, positive saline contrast bubble study     Current Meds  Medication Sig  . amLODipine (NORVASC) 2.5 MG tablet TAKE 1 TABLET BY MOUTH EVERY DAY  . Ascorbic Acid (VITAMIN C) 1000 MG tablet Take 1,000 mg by mouth every evening.  Marland Kitchen aspirin 325 MG EC tablet Take 1 tablet (325 mg total) by mouth daily.  Marland Kitchen atorvastatin (LIPITOR) 10 MG tablet TAKE 1 TABLET (10 MG TOTAL) BY MOUTH DAILY AT 6 PM.  . busPIRone (BUSPAR) 7.5 MG tablet  Take 10 mg by mouth 2 (two) times daily. TAKE TWO TABLET DAILY TOTAL 20MG   . Calcium Carbonate-Vitamin D (CALCIUM + D PO) Take 1 tablet by mouth every evening.  . ciprofloxacin (CIPRO) 500 MG tablet Take 500 mg by mouth 2 (two) times daily.  Marland Kitchen esomeprazole (NEXIUM) 20 MG capsule Take 20 mg by mouth daily at 12 noon.  . estrogens, conjugated, (PREMARIN) 0.3 MG tablet Take 0.625 mg by mouth daily. Take daily for 21 days then do not take for 7 days.  . hydrOXYzine (ATARAX/VISTARIL) 10 MG tablet Take 10 mg by mouth every 6 (six) hours  as needed.  . Multiple Vitamin (MULTIVITAMIN PO) Take by mouth.  . nebivolol (BYSTOLIC) 10 MG tablet TAKE 1 TABLET BY MOUTH EVERY DAY  . SUMAtriptan (IMITREX) 100 MG tablet TAKE 1 TAB BY MOUTH EVERY 2 HOURS AS NEEDED FOR MIGRAINE MAY REPEAT IN 2 HOURS IF PERSISTS OR RECURS     Allergies:   Codeine, Morphine, Morphine and related, and Sulfa antibiotics   Social History   Tobacco Use  . Smoking status: Never Smoker  . Smokeless tobacco: Never Used  Vaping Use  . Vaping Use: Never used  Substance Use Topics  . Alcohol use: Yes    Alcohol/week: 1.0 standard drink    Types: 1 Glasses of wine per week    Comment: rare / wine  . Drug use: No     Family Hx: The patient's family history includes Heart disease in her father and mother; Hypertension in her father.  ROS:   Please see the history of present illness.   All other systems are reviewed and are negative.  Prior CV studies:   The following studies were reviewed today:  Labs/Other Tests and Data Reviewed:    EKG: ECG performed today is significant for sinus bradycardia 57 bpm but is otherwise completely normal without repolarization abnormalities.  QTc 410 ms  Recent Labs: No results found for requested labs within last 8760 hours.   09/04/2020 creatinine 1.17, hemoglobin A1c 5.4%, normal liver function tests 09/11/2020 TSH 3.33  Recent Lipid Panel Lab Results  Component Value Date/Time   CHOL 142 10/31/2017 09:37 AM   TRIG 108 10/31/2017 09:37 AM   HDL 52 10/31/2017 09:37 AM   CHOLHDL 2.7 10/31/2017 09:37 AM   CHOLHDL 2.5 05/10/2016 08:02 AM   LDLCALC 68 10/31/2017 09:37 AM   09/04/2020 Cholesterol 108, HDL 52, LDL 37, triglycerides 102  Wt Readings from Last 3 Encounters:  12/09/20 194 lb 9.6 oz (88.3 kg)  05/08/20 181 lb 4.8 oz (82.2 kg)  05/05/20 182 lb (82.6 kg)     Objective:    Vital Signs:  BP 130/74   Pulse (!) 57   Ht 5\' 8"  (1.727 m)   Wt 194 lb 9.6 oz (88.3 kg)   SpO2 94%   BMI 29.59 kg/m      General: Alert, oriented x3, no distress, overweight Head: no evidence of trauma, PERRL, EOMI, no exophtalmos or lid lag, no myxedema, no xanthelasma; normal ears, nose and oropharynx Neck: normal jugular venous pulsations and no hepatojugular reflux; brisk carotid pulses without delay and no carotid bruits Chest: clear to auscultation, no signs of consolidation by percussion or palpation, normal fremitus, symmetrical and full respiratory excursions Cardiovascular: normal position and quality of the apical impulse, regular rhythm, normal first and second heart sounds, no murmurs, rubs or gallops Abdomen: no tenderness or distention, no masses by palpation, no abnormal pulsatility or arterial bruits, normal bowel  sounds, no hepatosplenomegaly Extremities: no clubbing, cyanosis or edema; 2+ radial, ulnar and brachial pulses bilaterally; 2+ right femoral, posterior tibial and dorsalis pedis pulses; 2+ left femoral, posterior tibial and dorsalis pedis pulses; no subclavian or femoral bruits Neurological: grossly nonfocal Psych: Normal mood and affect   ASSESSMENT & PLAN:    1. History of CVA: This occurred 5 years ago, has resolved without sequelae and its etiology was never identified.  She is on chronic antiplatelet therapy. 2. HTN: Well-controlled on nebivolol and amlodipine.  No overt side effects.  Mild bradycardia is well-tolerated. 3. HLP: All lipid parameters are in desirable range on current medications.  Because of previous stroke target LDL is less than 70.  Well within that range on a low-dose of atorvastatin. 4. Overweight: She continues efforts at weight loss. 5. Anxiety: Not clearly explained.  Reasonably well controlled with current medications.  She believes it may be work-related and she is not far from her retirement.  Patient Instructions  Medication Instructions:  No changes *If you need a refill on your cardiac medications before your next appointment, please call your  pharmacy*   Lab Work: None ordered If you have labs (blood work) drawn today and your tests are completely normal, you will receive your results only by: Marland Kitchen MyChart Message (if you have MyChart) OR . A paper copy in the mail If you have any lab test that is abnormal or we need to change your treatment, we will call you to review the results.   Testing/Procedures: None ordered   Follow-Up: At [name], you and your health needs are our priority.  As part of our continuing mission to provide you with exceptional heart care, we have created designated Provider Care Teams.  These Care Teams include your primary Cardiologist (physician) and Advanced Practice Providers (APPs -  Physician Assistants and Nurse Practitioners) who all work together to provide you with the care you need, when you need it.  We recommend signing up for the patient portal called "MyChart".  Sign up information is provided on this After Visit Summary.  MyChart is used to connect with patients for Virtual Visits (Telemedicine).  Patients are able to view lab/test results, encounter notes, upcoming appointments, etc.  Non-urgent messages can be sent to your provider as well.   To learn more about what you can do with MyChart, go to [email or web address].    Your next appointment:   12 month(s)  The format for your next appointment:   In Person  Provider:   You may see Raven Klein, MD or one of the following Advanced Practice Providers on your designated Care Team:    Almyra Deforest, PA-C  Fabian Sharp, [place] or   Roby Lofts, PA-C     Signed, Raven Klein, MD  12/10/2020 5:59 PM    [place]

## 2020-12-10 ENCOUNTER — Ambulatory Visit: Payer: Commercial Managed Care - PPO | Admitting: Adult Health

## 2020-12-25 ENCOUNTER — Encounter: Payer: Self-pay | Admitting: Adult Health

## 2020-12-25 ENCOUNTER — Ambulatory Visit: Payer: Commercial Managed Care - PPO | Admitting: Adult Health

## 2020-12-25 ENCOUNTER — Other Ambulatory Visit: Payer: Self-pay

## 2020-12-25 DIAGNOSIS — F411 Generalized anxiety disorder: Secondary | ICD-10-CM

## 2020-12-25 DIAGNOSIS — F331 Major depressive disorder, recurrent, moderate: Secondary | ICD-10-CM | POA: Diagnosis not present

## 2020-12-25 MED ORDER — SERTRALINE HCL 50 MG PO TABS: 50.0000 mg | ORAL_TABLET | Freq: Every day | ORAL | 5 refills | Status: DC

## 2020-12-25 MED ORDER — HYDROXYZINE HCL 10 MG PO TABS: 10.0000 mg | ORAL_TABLET | Freq: Four times a day (QID) | ORAL | 5 refills | Status: DC | PRN

## 2020-12-25 NOTE — Progress Notes (Signed)
[name] [id number] 1955/09/21 65 y.o.  Subjective:   Patient ID:  Raven [name] is a 65 y.o. (DOB [birth year]) female.  Chief Complaint: No chief complaint on file.   HPI BEAUTIFULL CISAR presents to the office today for follow-up of MDD and GAD.  Describes mood today as "ok". Pleasant. Mood symptoms - denies depression and irritability. Feels anxious at times. Has continued taking Buspar for anxiety and feels like it is helpful. Is having some untoward side effects and is considering other options. Reports feeling sick and dizzy from the Buspar. Symptoms have not improved over past several months of taking the medication. Improved interest and motivation. Taking medications as prescribed.  Energy levels stable. Active, does not have a regular exercise routine.  Enjoys some usual interests and activities. Married. Lives with husband of 34 years and 2 dogs. Has 3 daughters and 1 son. Spending time with family. Appetite adequate. Weight stable - may have gained 5 pounds. Sleeps well most nights. Averages 8 hours.   Focus and concentration stable. Completing tasks. Managing aspects of household. 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  Denies AH or VH. Therapist - Bartolo Darter.  Previous medication trials: Buspar, Hydroxyzine   Review of Systems:  Review of Systems  Musculoskeletal:  Negative for gait problem.  Neurological:  Negative for tremors.  Psychiatric/Behavioral:         Please refer to HPI   Medications: I have reviewed the patient's current medications.  Current Outpatient Medications  Medication Sig Dispense Refill   sertraline (ZOLOFT) 50 MG tablet Take 1 tablet (50 mg total) by mouth daily. 30 tablet 5   amLODipine (NORVASC) 2.5 MG tablet TAKE 1 TABLET BY MOUTH EVERY DAY 90 tablet 3   Ascorbic Acid (VITAMIN C) 1000 MG tablet Take 1,000 mg by mouth every evening.     aspirin 325 MG EC tablet Take 1 tablet (325 mg total) by mouth daily. 30 tablet 1    atorvastatin (LIPITOR) 10 MG tablet TAKE 1 TABLET (10 MG TOTAL) BY MOUTH DAILY AT 6 PM. 90 tablet 3   busPIRone (BUSPAR) 7.5 MG tablet Take 10 mg by mouth 2 (two) times daily. TAKE TWO TABLET DAILY TOTAL 20MG      Calcium Carbonate-Vitamin D (CALCIUM + D PO) Take 1 tablet by mouth every evening.     ciprofloxacin (CIPRO) 500 MG tablet Take 500 mg by mouth 2 (two) times daily.     esomeprazole (NEXIUM) 20 MG capsule Take 20 mg by mouth daily at 12 noon.     estrogens, conjugated, (PREMARIN) 0.3 MG tablet Take 0.625 mg by mouth daily. Take daily for 21 days then do not take for 7 days.     hydrOXYzine (ATARAX/VISTARIL) 10 MG tablet Take 1 tablet (10 mg total) by mouth every 6 (six) hours as needed. 30 tablet 5   Multiple Vitamin (MULTIVITAMIN PO) Take by mouth.     nebivolol (BYSTOLIC) 10 MG tablet TAKE 1 TABLET BY MOUTH EVERY DAY 90 tablet 1   promethazine (PHENERGAN) 25 MG suppository Place 1 suppository (25 mg total) rectally every 6 (six) hours as needed for nausea. 12 suppository 3   SUMAtriptan (IMITREX) 100 MG tablet TAKE 1 TAB BY MOUTH EVERY 2 HOURS AS NEEDED FOR MIGRAINE MAY REPEAT IN 2 HOURS IF PERSISTS OR RECURS 18 tablet 11   No current facility-administered medications for this visit.    Medication Side Effects: None  Allergies:  Allergies  Allergen Reactions   Codeine  Nausea Only, Other (See Comments) and Nausea And Vomiting    "not well tolerated"   Morphine Nausea Only and Other (See Comments)    "not well tolerated"    Morphine And Related Nausea Only and Other (See Comments)    "not well tolerated"   Sulfa Antibiotics Itching, Hives and Rash    Past Medical History:  Diagnosis Date   Abnormal Pap smear 08/2005   Cancer ([place])    cervical   Diverticulosis    History of migraines    Hypertension    Kidney stones 2012   Menopause    Migraine    PFO (patent foramen ovale)    PONV (postoperative nausea and vomiting)    Seasonal allergies    Stroke [facility])      Past Medical History, Surgical history, Social history, and Family history were reviewed and updated as appropriate.   Please see review of systems for further details on the patient's review from today.   Objective:   Physical Exam:  There were no vitals taken for this visit.  Physical Exam Constitutional:      General: She is not in acute distress. Musculoskeletal:        General: No deformity.  Neurological:     Mental Status: She is alert and oriented to person, place, and time.     Coordination: Coordination normal.  Psychiatric:        Attention and Perception: Attention and perception normal. She does not perceive auditory or visual hallucinations.        Mood and Affect: Mood normal. Mood is not anxious or depressed. Affect is not labile, blunt, angry or inappropriate.        Speech: Speech normal.        Behavior: Behavior normal.        Thought Content: Thought content normal. Thought content is not paranoid or delusional. Thought content does not include homicidal or suicidal ideation. Thought content does not include homicidal or suicidal plan.        Cognition and Memory: Cognition and memory normal.        Judgment: Judgment normal.     Comments: Insight intact    Lab Review:     Component Value Date/Time   NA 141 10/31/2017 0937   K 3.8 10/31/2017 0937   CL 103 10/31/2017 0937   CO2 23 10/31/2017 0937   GLUCOSE 76 10/31/2017 0937   GLUCOSE 85 10/21/2015 0343   BUN 13 10/31/2017 0937   CREATININE 1.13 (H) 10/31/2017 0937   CALCIUM 9.4 10/31/2017 0937   PROT 6.7 10/31/2017 0937   ALBUMIN 4.3 10/31/2017 0937   AST 18 10/31/2017 0937   ALT 16 10/31/2017 0937   ALKPHOS 99 10/31/2017 0937   BILITOT 0.6 10/31/2017 0937   GFRNONAA 53 (L) 10/31/2017 0937   GFRAA 61 10/31/2017 0937       Component Value Date/Time   WBC 5.9 10/21/2015 0343   RBC 4.32 10/21/2015 0343   HGB 13.0 10/21/2015 0343   HCT 39.4 10/21/2015 0343   PLT 164 10/21/2015 0343   MCV  91.2 10/21/2015 0343   MCH 30.1 10/21/2015 0343   MCHC 33.0 10/21/2015 0343   RDW 12.8 10/21/2015 0343   LYMPHSABS 2.0 10/21/2015 0343   MONOABS 0.5 10/21/2015 0343   EOSABS 0.1 10/21/2015 0343   BASOSABS 0.0 10/21/2015 0343    No results found for: POCLITH, LITHIUM   No results found for: PHENYTOIN, PHENOBARB, VALPROATE, CBMZ   .res Assessment:  Plan:     Plan:  PDMP reviewed  1. Buspar 11/20/08 taper 2. Hydroxyzine 10mg  BID 3. Add Zoloft 50mg  - 1/2 tablet daily x 7 days, then increase to 50mg  daily.  Read and reviewed note with patient for accuracy.   RTC 2/3 months.  Patient advised to contact office with any questions, adverse effects, or acute worsening in signs and symptoms.    Diagnoses and all orders for this visit:  Major depressive disorder, recurrent episode, moderate (HCC) -     sertraline (ZOLOFT) 50 MG tablet; Take 1 tablet (50 mg total) by mouth daily.  Generalized anxiety disorder -     sertraline (ZOLOFT) 50 MG tablet; Take 1 tablet (50 mg total) by mouth daily. -     hydrOXYzine (ATARAX/VISTARIL) 10 MG tablet; Take 1 tablet (10 mg total) by mouth every 6 (six) hours as needed.    Please see After Visit Summary for patient specific instructions.  Future Appointments  Date Time Provider [place]  02/24/2021  8:00 AM Danijela Vessey, Berdie Ogren, NP CP-CP None    No orders of the defined types were placed in this encounter.   -------------------------------

## 2021-01-05 ENCOUNTER — Other Ambulatory Visit: Payer: Self-pay | Admitting: Cardiovascular Disease

## 2021-01-16 ENCOUNTER — Other Ambulatory Visit: Payer: Self-pay | Admitting: Adult Health

## 2021-01-16 DIAGNOSIS — F331 Major depressive disorder, recurrent, moderate: Secondary | ICD-10-CM

## 2021-01-16 DIAGNOSIS — F411 Generalized anxiety disorder: Secondary | ICD-10-CM

## 2021-02-12 ENCOUNTER — Other Ambulatory Visit: Payer: Self-pay | Admitting: Cardiovascular Disease

## 2021-02-24 ENCOUNTER — Other Ambulatory Visit: Payer: Self-pay

## 2021-02-24 ENCOUNTER — Ambulatory Visit (INDEPENDENT_AMBULATORY_CARE_PROVIDER_SITE_OTHER): Payer: Commercial Managed Care - PPO | Admitting: Adult Health

## 2021-02-24 ENCOUNTER — Encounter: Payer: Self-pay | Admitting: Adult Health

## 2021-02-24 DIAGNOSIS — F411 Generalized anxiety disorder: Secondary | ICD-10-CM | POA: Diagnosis not present

## 2021-02-24 MED ORDER — BUSPIRONE HCL 10 MG PO TABS: 10.0000 mg | ORAL_TABLET | Freq: Two times a day (BID) | ORAL | 3 refills | Status: DC

## 2021-02-24 NOTE — Progress Notes (Signed)
Raven [name]  [id number] 23-Oct-1955 65 y.o.  Subjective:   Patient ID:  Raven [name] is a 65 y.o. (DOB Dec 10, 1955) female.  Chief Complaint: No chief complaint on file.   HPI Raven [name] presents to the office today for follow-up of MDD and GAD.  Describes mood today as "ok". Pleasant. Mood symptoms - denies depression and irritability. Reports anxiety - "a lot of days". Did not tolerate the addition on Zoloft and switched back to taking the Buspar. Stating "I'm doing alright". Reports brother passing away from Covid complications since last visit. Stable interest and motivation. Taking medications as prescribed.  Energy levels stable. Active, does not have a regular exercise routine. Enjoys some usual interests and activities. Married. Lives with husband and 2 dogs. Has 3 daughters and 1 son. Spending time with family. Appetite adequate. Weight stable. Sleeps well most nights. Averages 8 hours.   Focus and concentration stable. Completing tasks. Managing aspects of household. 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  Denies AH or VH. Therapist - Bartolo Darter.  Previous medication trials: Buspar, Hydroxyzine    Review of Systems:  Review of Systems  Musculoskeletal:  Negative for gait problem.  Neurological:  Negative for tremors.  Psychiatric/Behavioral:         Please refer to HPI   Medications: I have reviewed the patient's current medications.  Current Outpatient Medications  Medication Sig Dispense Refill   amLODipine (NORVASC) 2.5 MG tablet TAKE 1 TABLET BY MOUTH EVERY DAY 90 tablet 3   Ascorbic Acid (VITAMIN C) 1000 MG tablet Take 1,000 mg by mouth every evening.     aspirin 325 MG EC tablet Take 1 tablet (325 mg total) by mouth daily. 30 tablet 1   atorvastatin (LIPITOR) 10 MG tablet TAKE 1 TABLET (10 MG TOTAL) BY MOUTH DAILY AT 6 PM. 90 tablet 3   busPIRone (BUSPAR) 10 MG tablet Take 1 tablet (10 mg total) by mouth 2 (two) times daily. 180  tablet 3   Calcium Carbonate-Vitamin D (CALCIUM + D PO) Take 1 tablet by mouth every evening.     ciprofloxacin (CIPRO) 500 MG tablet Take 500 mg by mouth 2 (two) times daily.     esomeprazole (NEXIUM) 20 MG capsule Take 20 mg by mouth daily at 12 noon.     estrogens, conjugated, (PREMARIN) 0.3 MG tablet Take 0.625 mg by mouth daily. Take daily for 21 days then do not take for 7 days.     hydrOXYzine (ATARAX/VISTARIL) 10 MG tablet Take 1 tablet (10 mg total) by mouth every 6 (six) hours as needed. 30 tablet 5   Multiple Vitamin (MULTIVITAMIN PO) Take by mouth.     nebivolol (BYSTOLIC) 10 MG tablet TAKE 1 TABLET BY MOUTH EVERY DAY 90 tablet 1   promethazine (PHENERGAN) 25 MG suppository Place 1 suppository (25 mg total) rectally every 6 (six) hours as needed for nausea. 12 suppository 3   SUMAtriptan (IMITREX) 100 MG tablet TAKE 1 TAB BY MOUTH EVERY 2 HOURS AS NEEDED FOR MIGRAINE MAY REPEAT IN 2 HOURS IF PERSISTS OR RECURS 18 tablet 11   No current facility-administered medications for this visit.    Medication Side Effects: None  Allergies:  Allergies  Allergen Reactions   Codeine Nausea Only, Other (See Comments) and Nausea And Vomiting    "not well tolerated"   Morphine Nausea Only and Other (See Comments)    "not well tolerated"    Morphine And Related Nausea Only and Other (See  Comments)    "not well tolerated"   Sulfa Antibiotics Itching, Hives and Rash    Past Medical History:  Diagnosis Date   Abnormal Pap smear 08/2005   Cancer [facility])    cervical   Diverticulosis    History of migraines    Hypertension    Kidney stones 2012   Menopause    Migraine    PFO (patent foramen ovale)    PONV (postoperative nausea and vomiting)    Seasonal allergies    Stroke [facility])     Past Medical History, Surgical history, Social history, and Family history were reviewed and updated as appropriate.   Please see review of systems for further details on the patient's review from today.    Objective:   Physical Exam:  There were no vitals taken for this visit.  Physical Exam Constitutional:      General: She is not in acute distress. Musculoskeletal:        General: No deformity.  Neurological:     Mental Status: She is alert and oriented to person, place, and time.     Coordination: Coordination normal.  Psychiatric:        Attention and Perception: Attention and perception normal. She does not perceive auditory or visual hallucinations.        Mood and Affect: Mood normal. Mood is not anxious or depressed. Affect is not labile, blunt, angry or inappropriate.        Speech: Speech normal.        Behavior: Behavior normal.        Thought Content: Thought content normal. Thought content is not paranoid or delusional. Thought content does not include homicidal or suicidal ideation. Thought content does not include homicidal or suicidal plan.        Cognition and Memory: Cognition and memory normal.        Judgment: Judgment normal.     Comments: Insight intact    Lab Review:     Component Value Date/Time   NA 141 10/31/2017 0937   K 3.8 10/31/2017 0937   CL 103 10/31/2017 0937   CO2 23 10/31/2017 0937   GLUCOSE 76 10/31/2017 0937   GLUCOSE 85 10/21/2015 0343   BUN 13 10/31/2017 0937   CREATININE 1.13 (H) 10/31/2017 0937   CALCIUM 9.4 10/31/2017 0937   PROT 6.7 10/31/2017 0937   ALBUMIN 4.3 10/31/2017 0937   AST 18 10/31/2017 0937   ALT 16 10/31/2017 0937   ALKPHOS 99 10/31/2017 0937   BILITOT 0.6 10/31/2017 0937   GFRNONAA 53 (L) 10/31/2017 0937   GFRAA 61 10/31/2017 0937       Component Value Date/Time   WBC 5.9 10/21/2015 0343   RBC 4.32 10/21/2015 0343   HGB 13.0 10/21/2015 0343   HCT 39.4 10/21/2015 0343   PLT 164 10/21/2015 0343   MCV 91.2 10/21/2015 0343   MCH 30.1 10/21/2015 0343   MCHC 33.0 10/21/2015 0343   RDW 12.8 10/21/2015 0343   LYMPHSABS 2.0 10/21/2015 0343   MONOABS 0.5 10/21/2015 0343   EOSABS 0.1 10/21/2015 0343   BASOSABS  0.0 10/21/2015 0343    No results found for: POCLITH, LITHIUM   No results found for: PHENYTOIN, PHENOBARB, VALPROATE, CBMZ   .res Assessment: Plan:    Plan:  PDMP reviewed  1. Buspar '10mg'$  BID  2. Hydroxyzine '10mg'$  BID 3. D/C Zoloft '50mg'$  daily  Read and reviewed note with patient for accuracy.   RTC 2/3 months.  Patient advised to  contact office with any questions, adverse effects, or acute worsening in signs and symptoms.  Diagnoses and all orders for this visit:  Generalized anxiety disorder -     Discontinue: busPIRone (BUSPAR) 10 MG tablet; Take 1 tablet (10 mg total) by mouth 2 (two) times daily. TAKE TWO TABLET DAILY TOTAL '20MG'$  -     busPIRone (BUSPAR) 10 MG tablet; Take 1 tablet (10 mg total) by mouth 2 (two) times daily.    Please see After Visit Summary for patient specific instructions.  No future appointments.   No orders of the defined types were placed in this encounter.   -------------------------------

## 2021-04-30 ENCOUNTER — Other Ambulatory Visit: Payer: Self-pay

## 2021-04-30 MED ORDER — NEBIVOLOL HCL 10 MG PO TABS
10.0000 mg | ORAL_TABLET | Freq: Every day | ORAL | 1 refills | Status: DC
Start: 2021-04-30 — End: 2021-06-16

## 2021-06-16 ENCOUNTER — Other Ambulatory Visit: Payer: Self-pay | Admitting: Cardiovascular Disease

## 2021-08-25 ENCOUNTER — Other Ambulatory Visit: Payer: Self-pay

## 2021-08-25 ENCOUNTER — Encounter: Payer: Self-pay | Admitting: Adult Health

## 2021-08-25 ENCOUNTER — Ambulatory Visit: Payer: Medicare Other | Admitting: Adult Health

## 2021-08-25 DIAGNOSIS — F411 Generalized anxiety disorder: Secondary | ICD-10-CM | POA: Diagnosis not present

## 2021-08-25 DIAGNOSIS — F331 Major depressive disorder, recurrent, moderate: Secondary | ICD-10-CM | POA: Diagnosis not present

## 2021-08-25 MED ORDER — BUSPIRONE HCL 10 MG PO TABS: 10.0000 mg | ORAL_TABLET | Freq: Two times a day (BID) | ORAL | 3 refills | Status: DC

## 2021-08-25 NOTE — Progress Notes (Signed)
Raven [name] [id number] [birth year] 66 y.o.  Subjective:   Patient ID:  Raven [name] is a 66 y.o. (DOB [birth year]) female.  Chief Complaint: No chief complaint on file.   HPI Raven [name] presents to the office today for follow-up of MDD and GAD.  Describes mood today as "ok". Pleasant. Mood symptoms - denies depression. Feels irritable at times. Reports decreased anxiety - "I go 4 days a week and not even notice it". Feels like Buspar continues to work well. Stable interest and motivation. Taking medications as prescribed.  Energy levels stable. Active, does not have a regular exercise routine. Walking some. Likes to bike when weather permits. Enjoys some usual interests and activities. Married. Lives with husband and 3 dogs. Has 3 daughters and 1 son - 5 grandchildren. Spending time with family. Likes to camp. Appetite adequate. Weight stable. Sleeps well most nights. Averages 8 hours.   Focus and concentration stable. Completing tasks. Managing aspects of household. 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  Denies AH or VH. Therapist - Bartolo Darter.  Previous medication trials: Buspar, Hydroxyzine     Review of Systems:  Review of Systems  Musculoskeletal:  Negative for gait problem.  Neurological:  Negative for tremors.  Psychiatric/Behavioral:         Please refer to HPI   Medications: I have reviewed the patient's current medications.  Current Outpatient Medications  Medication Sig Dispense Refill   amLODipine (NORVASC) 2.5 MG tablet TAKE 1 TABLET BY MOUTH EVERY DAY 90 tablet 3   Ascorbic Acid (VITAMIN C) 1000 MG tablet Take 1,000 mg by mouth every evening.     aspirin 325 MG EC tablet Take 1 tablet (325 mg total) by mouth daily. 30 tablet 1   atorvastatin (LIPITOR) 10 MG tablet TAKE 1 TABLET (10 MG TOTAL) BY MOUTH DAILY AT 6 PM. 90 tablet 3   busPIRone (BUSPAR) 10 MG tablet Take 1 tablet (10 mg total) by mouth 2 (two) times daily. 180 tablet 3    Calcium Carbonate-Vitamin D (CALCIUM + D PO) Take 1 tablet by mouth every evening.     ciprofloxacin (CIPRO) 500 MG tablet Take 500 mg by mouth 2 (two) times daily.     esomeprazole (NEXIUM) 20 MG capsule Take 20 mg by mouth daily at 12 noon.     estrogens, conjugated, (PREMARIN) 0.3 MG tablet Take 0.625 mg by mouth daily. Take daily for 21 days then do not take for 7 days.     hydrOXYzine (ATARAX/VISTARIL) 10 MG tablet Take 1 tablet (10 mg total) by mouth every 6 (six) hours as needed. 30 tablet 5   Multiple Vitamin (MULTIVITAMIN PO) Take by mouth.     nebivolol (BYSTOLIC) 10 MG tablet TAKE 1 TABLET BY MOUTH EVERY DAY 90 tablet 2   promethazine (PHENERGAN) 25 MG suppository Place 1 suppository (25 mg total) rectally every 6 (six) hours as needed for nausea. 12 suppository 3   SUMAtriptan (IMITREX) 100 MG tablet TAKE 1 TAB BY MOUTH EVERY 2 HOURS AS NEEDED FOR MIGRAINE MAY REPEAT IN 2 HOURS IF PERSISTS OR RECURS 18 tablet 11   No current facility-administered medications for this visit.    Medication Side Effects: None  Allergies:  Allergies  Allergen Reactions   Codeine Nausea Only, Other (See Comments) and Nausea And Vomiting    "not well tolerated"   Morphine Nausea Only and Other (See Comments)    "not well tolerated"    Morphine And Related Nausea  Only and Other (See Comments)    "not well tolerated"   Sulfa Antibiotics Itching, Hives and Rash    Past Medical History:  Diagnosis Date   Abnormal Pap smear 08/2005   Cancer [facility])    cervical   Diverticulosis    History of migraines    Hypertension    Kidney stones 2012   Menopause    Migraine    PFO (patent foramen ovale)    PONV (postoperative nausea and vomiting)    Seasonal allergies    Stroke [facility])     Past Medical History, Surgical history, Social history, and Family history were reviewed and updated as appropriate.   Please see review of systems for further details on the patient's review from today.    Objective:   Physical Exam:  There were no vitals taken for this visit.  Physical Exam Constitutional:      General: She is not in acute distress. Musculoskeletal:        General: No deformity.  Neurological:     Mental Status: She is alert and oriented to person, place, and time.     Coordination: Coordination normal.  Psychiatric:        Attention and Perception: Attention and perception normal. She does not perceive auditory or visual hallucinations.        Mood and Affect: Mood normal. Mood is not anxious or depressed. Affect is not labile, blunt, angry or inappropriate.        Speech: Speech normal.        Behavior: Behavior normal.        Thought Content: Thought content normal. Thought content is not paranoid or delusional. Thought content does not include homicidal or suicidal ideation. Thought content does not include homicidal or suicidal plan.        Cognition and Memory: Cognition and memory normal.        Judgment: Judgment normal.     Comments: Insight intact    Lab Review:     Component Value Date/Time   NA 141 10/31/2017 0937   K 3.8 10/31/2017 0937   CL 103 10/31/2017 0937   CO2 23 10/31/2017 0937   GLUCOSE 76 10/31/2017 0937   GLUCOSE 85 10/21/2015 0343   BUN 13 10/31/2017 0937   CREATININE 1.13 (H) 10/31/2017 0937   CALCIUM 9.4 10/31/2017 0937   PROT 6.7 10/31/2017 0937   ALBUMIN 4.3 10/31/2017 0937   AST 18 10/31/2017 0937   ALT 16 10/31/2017 0937   ALKPHOS 99 10/31/2017 0937   BILITOT 0.6 10/31/2017 0937   GFRNONAA 53 (L) 10/31/2017 0937   GFRAA 61 10/31/2017 0937       Component Value Date/Time   WBC 5.9 10/21/2015 0343   RBC 4.32 10/21/2015 0343   HGB 13.0 10/21/2015 0343   HCT 39.4 10/21/2015 0343   PLT 164 10/21/2015 0343   MCV 91.2 10/21/2015 0343   MCH 30.1 10/21/2015 0343   MCHC 33.0 10/21/2015 0343   RDW 12.8 10/21/2015 0343   LYMPHSABS 2.0 10/21/2015 0343   MONOABS 0.5 10/21/2015 0343   EOSABS 0.1 10/21/2015 0343   BASOSABS  0.0 10/21/2015 0343    No results found for: POCLITH, LITHIUM   No results found for: PHENYTOIN, PHENOBARB, VALPROATE, CBMZ   .res Assessment: Plan:    Plan:  PDMP reviewed  1. Buspar 10mg  BID  2. Hydroxyzine 10mg  BID  RTC 2/3 months.  Patient advised to contact office with any questions, adverse effects, or acute worsening in  signs and symptoms.  Diagnoses and all orders for this visit:  Generalized anxiety disorder -     busPIRone (BUSPAR) 10 MG tablet; Take 1 tablet (10 mg total) by mouth 2 (two) times daily.     Please see After Visit Summary for patient specific instructions.  No future appointments.   No orders of the defined types were placed in this encounter.   -------------------------------

## 2021-09-08 DIAGNOSIS — R946 Abnormal results of thyroid function studies: Secondary | ICD-10-CM | POA: Diagnosis not present

## 2021-09-08 DIAGNOSIS — R7309 Other abnormal glucose: Secondary | ICD-10-CM | POA: Diagnosis not present

## 2021-09-08 DIAGNOSIS — I10 Essential (primary) hypertension: Secondary | ICD-10-CM | POA: Diagnosis not present

## 2021-09-08 DIAGNOSIS — Z0000 Encounter for general adult medical examination without abnormal findings: Secondary | ICD-10-CM | POA: Diagnosis not present

## 2021-09-08 DIAGNOSIS — E559 Vitamin D deficiency, unspecified: Secondary | ICD-10-CM | POA: Diagnosis not present

## 2021-09-15 DIAGNOSIS — Z780 Asymptomatic menopausal state: Secondary | ICD-10-CM | POA: Diagnosis not present

## 2021-09-15 DIAGNOSIS — Z23 Encounter for immunization: Secondary | ICD-10-CM | POA: Diagnosis not present

## 2021-09-15 DIAGNOSIS — Z87442 Personal history of urinary calculi: Secondary | ICD-10-CM | POA: Diagnosis not present

## 2021-09-15 DIAGNOSIS — Z0000 Encounter for general adult medical examination without abnormal findings: Secondary | ICD-10-CM | POA: Diagnosis not present

## 2021-09-15 DIAGNOSIS — R7989 Other specified abnormal findings of blood chemistry: Secondary | ICD-10-CM | POA: Diagnosis not present

## 2021-09-15 DIAGNOSIS — Z1211 Encounter for screening for malignant neoplasm of colon: Secondary | ICD-10-CM | POA: Diagnosis not present

## 2021-09-15 DIAGNOSIS — E559 Vitamin D deficiency, unspecified: Secondary | ICD-10-CM | POA: Diagnosis not present

## 2021-09-15 DIAGNOSIS — M5136 Other intervertebral disc degeneration, lumbar region: Secondary | ICD-10-CM | POA: Diagnosis not present

## 2021-09-15 DIAGNOSIS — Z8673 Personal history of transient ischemic attack (TIA), and cerebral infarction without residual deficits: Secondary | ICD-10-CM | POA: Diagnosis not present

## 2021-09-15 DIAGNOSIS — I10 Essential (primary) hypertension: Secondary | ICD-10-CM | POA: Diagnosis not present

## 2021-09-15 DIAGNOSIS — R7309 Other abnormal glucose: Secondary | ICD-10-CM | POA: Diagnosis not present

## 2021-09-15 DIAGNOSIS — G43909 Migraine, unspecified, not intractable, without status migrainosus: Secondary | ICD-10-CM | POA: Diagnosis not present

## 2021-10-27 DIAGNOSIS — Z20822 Contact with and (suspected) exposure to covid-19: Secondary | ICD-10-CM | POA: Diagnosis not present

## 2021-11-16 ENCOUNTER — Other Ambulatory Visit: Payer: Self-pay | Admitting: Obstetrics and Gynecology

## 2021-11-16 DIAGNOSIS — Z1231 Encounter for screening mammogram for malignant neoplasm of breast: Secondary | ICD-10-CM

## 2021-11-30 ENCOUNTER — Other Ambulatory Visit: Payer: Self-pay | Admitting: Cardiovascular Disease

## 2021-12-02 ENCOUNTER — Ambulatory Visit: Payer: Commercial Managed Care - PPO

## 2021-12-08 ENCOUNTER — Ambulatory Visit
Admission: RE | Admit: 2021-12-08 | Discharge: 2021-12-08 | Disposition: A | Discharge status: Home / Self Care | Payer: Medicare Other | Source: Ambulatory Visit | Attending: Obstetrics and Gynecology | Admitting: Obstetrics and Gynecology

## 2021-12-08 DIAGNOSIS — Z1231 Encounter for screening mammogram for malignant neoplasm of breast: Secondary | ICD-10-CM

## 2022-03-24 DIAGNOSIS — Z1211 Encounter for screening for malignant neoplasm of colon: Secondary | ICD-10-CM | POA: Diagnosis not present

## 2022-03-24 DIAGNOSIS — Z1212 Encounter for screening for malignant neoplasm of rectum: Secondary | ICD-10-CM | POA: Diagnosis not present

## 2022-03-29 LAB — COLOGUARD: COLOGUARD: NEGATIVE

## 2022-06-17 DIAGNOSIS — N8110 Cystocele, unspecified: Secondary | ICD-10-CM | POA: Diagnosis not present

## 2022-08-24 ENCOUNTER — Ambulatory Visit: Payer: Medicare Other | Admitting: Adult Health

## 2022-08-24 ENCOUNTER — Encounter: Payer: Self-pay | Admitting: Adult Health

## 2022-08-24 DIAGNOSIS — F411 Generalized anxiety disorder: Secondary | ICD-10-CM | POA: Diagnosis not present

## 2022-08-24 MED ORDER — BUSPIRONE HCL 10 MG PO TABS: 10.0000 mg | ORAL_TABLET | Freq: Two times a day (BID) | ORAL | 3 refills | Status: DC

## 2022-08-24 NOTE — Progress Notes (Signed)
Raven [name] [id number] [birth year] 67 y.o.  Subjective:   Patient ID:  Raven [name] is a 67 y.o. (DOB 06-Jan-1956) female.  Chief Complaint: No chief complaint on file.   HPI Raven [name] presents to the office today for follow-up of MDD and GAD.  Describes mood today as "ok". Pleasant. Mood symptoms - denies depression and irritability. Reports decreased anxiety - "sometimes, not like it was before". Mood is consistent. Feels like Buspar continues to work well. Using Hydroxyzine as needed. Stable interest and motivation. Taking medications as prescribed.  Energy levels stable. Active, has a regular exercise routine. Walking some. Likes to bike when weather permits. Enjoys some usual interests and activities. Married. Lives with husband and 3 dogs. Has 3 daughters and 1 son - 5 grandchildren. Spending time with family.  Appetite adequate. Weight stable. Sleeps well most nights. Averages 8 hours.   Focus and concentration stable. Completing tasks. Managing aspects of household. 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  Denies AH or VH. Denies self harm. Denies substance use. Therapist - Bartolo Darter.  Previous medication trials: Buspar, Hydroxyzine    Review of Systems:  Review of Systems  Musculoskeletal:  Negative for gait problem.  Neurological:  Negative for tremors.  Psychiatric/Behavioral:         Please refer to HPI    Medications: I have reviewed the patient's current medications.  Current Outpatient Medications  Medication Sig Dispense Refill   amLODipine (NORVASC) 2.5 MG tablet TAKE 1 TABLET BY MOUTH EVERY DAY 90 tablet 3   Ascorbic Acid (VITAMIN C) 1000 MG tablet Take 1,000 mg by mouth every evening.     aspirin 325 MG EC tablet Take 1 tablet (325 mg total) by mouth daily. 30 tablet 1   atorvastatin (LIPITOR) 10 MG tablet TAKE 1 TABLET (10 MG TOTAL) BY MOUTH DAILY AT 6 PM. 90 tablet 3   busPIRone (BUSPAR) 10 MG tablet Take 1 tablet (10 mg  total) by mouth 2 (two) times daily. 180 tablet 3   Calcium Carbonate-Vitamin D (CALCIUM + D PO) Take 1 tablet by mouth every evening.     ciprofloxacin (CIPRO) 500 MG tablet Take 500 mg by mouth 2 (two) times daily.     esomeprazole (NEXIUM) 20 MG capsule Take 20 mg by mouth daily at 12 noon.     estrogens, conjugated, (PREMARIN) 0.3 MG tablet Take 0.625 mg by mouth daily. Take daily for 21 days then do not take for 7 days.     hydrOXYzine (ATARAX/VISTARIL) 10 MG tablet Take 1 tablet (10 mg total) by mouth every 6 (six) hours as needed. 30 tablet 5   Multiple Vitamin (MULTIVITAMIN PO) Take by mouth.     nebivolol (BYSTOLIC) 10 MG tablet TAKE 1 TABLET BY MOUTH  DAILY 90 tablet 3   promethazine (PHENERGAN) 25 MG suppository Place 1 suppository (25 mg total) rectally every 6 (six) hours as needed for nausea. 12 suppository 3   SUMAtriptan (IMITREX) 100 MG tablet TAKE 1 TAB BY MOUTH EVERY 2 HOURS AS NEEDED FOR MIGRAINE MAY REPEAT IN 2 HOURS IF PERSISTS OR RECURS 18 tablet 11   No current facility-administered medications for this visit.    Medication Side Effects: None  Allergies:  Allergies  Allergen Reactions   Codeine Nausea Only, Other (See Comments) and Nausea And Vomiting    "not well tolerated"   Morphine Nausea Only and Other (See Comments)    "not well tolerated"    Morphine And  Related Nausea Only and Other (See Comments)    "not well tolerated"   Sulfa Antibiotics Itching, Hives and Rash    Past Medical History:  Diagnosis Date   Abnormal Pap smear 08/2005   Cancer [facility])    cervical   Diverticulosis    History of migraines    Hypertension    Kidney stones 2012   Menopause    Migraine    PFO (patent foramen ovale)    PONV (postoperative nausea and vomiting)    Seasonal allergies    Stroke [facility])     Past Medical History, Surgical history, Social history, and Family history were reviewed and updated as appropriate.   Please see review of systems for further details  on the patient's review from today.   Objective:   Physical Exam:  There were no vitals taken for this visit.  Physical Exam Constitutional:      General: She is not in acute distress. Musculoskeletal:        General: No deformity.  Neurological:     Mental Status: She is alert and oriented to person, place, and time.     Coordination: Coordination normal.  Psychiatric:        Attention and Perception: Attention and perception normal. She does not perceive auditory or visual hallucinations.        Mood and Affect: Mood normal. Mood is not anxious or depressed. Affect is not labile, blunt, angry or inappropriate.        Speech: Speech normal.        Behavior: Behavior normal.        Thought Content: Thought content normal. Thought content is not paranoid or delusional. Thought content does not include homicidal or suicidal ideation. Thought content does not include homicidal or suicidal plan.        Cognition and Memory: Cognition and memory normal.        Judgment: Judgment normal.     Comments: Insight intact     Lab Review:     Component Value Date/Time   NA 141 10/31/2017 0937   K 3.8 10/31/2017 0937   CL 103 10/31/2017 0937   CO2 23 10/31/2017 0937   GLUCOSE 76 10/31/2017 0937   GLUCOSE 85 10/21/2015 0343   BUN 13 10/31/2017 0937   CREATININE 1.13 (H) 10/31/2017 0937   CALCIUM 9.4 10/31/2017 0937   PROT 6.7 10/31/2017 0937   ALBUMIN 4.3 10/31/2017 0937   AST 18 10/31/2017 0937   ALT 16 10/31/2017 0937   ALKPHOS 99 10/31/2017 0937   BILITOT 0.6 10/31/2017 0937   GFRNONAA 53 (L) 10/31/2017 0937   GFRAA 61 10/31/2017 0937       Component Value Date/Time   WBC 5.9 10/21/2015 0343   RBC 4.32 10/21/2015 0343   HGB 13.0 10/21/2015 0343   HCT 39.4 10/21/2015 0343   PLT 164 10/21/2015 0343   MCV 91.2 10/21/2015 0343   MCH 30.1 10/21/2015 0343   MCHC 33.0 10/21/2015 0343   RDW 12.8 10/21/2015 0343   LYMPHSABS 2.0 10/21/2015 0343   MONOABS 0.5 10/21/2015 0343    EOSABS 0.1 10/21/2015 0343   BASOSABS 0.0 10/21/2015 0343    No results found for: "POCLITH", "LITHIUM"   No results found for: "PHENYTOIN", "PHENOBARB", "VALPROATE", "CBMZ"   .res Assessment: Plan:    Plan:  PDMP reviewed  1. Buspar '10mg'$  BID - none needed today - will call for refills.  2. Hydroxyzine '10mg'$  daily as needed.  RTC 2/3 months.  Patient advised to contact office with any questions, adverse effects, or acute worsening in signs and symptoms.  There are no diagnoses linked to this encounter.   Please see After Visit Summary for patient specific instructions.  Future Appointments  Date Time Provider [place]  08/24/2022  8:00 AM Madysen Faircloth, Berdie Ogren, NP CP-CP None    No orders of the defined types were placed in this encounter.   -------------------------------

## 2022-09-21 DIAGNOSIS — R7309 Other abnormal glucose: Secondary | ICD-10-CM | POA: Diagnosis not present

## 2022-09-21 DIAGNOSIS — E559 Vitamin D deficiency, unspecified: Secondary | ICD-10-CM | POA: Diagnosis not present

## 2022-09-21 DIAGNOSIS — Z8673 Personal history of transient ischemic attack (TIA), and cerebral infarction without residual deficits: Secondary | ICD-10-CM | POA: Diagnosis not present

## 2022-09-21 DIAGNOSIS — I10 Essential (primary) hypertension: Secondary | ICD-10-CM | POA: Diagnosis not present

## 2022-09-21 DIAGNOSIS — Z0000 Encounter for general adult medical examination without abnormal findings: Secondary | ICD-10-CM | POA: Diagnosis not present

## 2022-09-28 DIAGNOSIS — Z0000 Encounter for general adult medical examination without abnormal findings: Secondary | ICD-10-CM | POA: Diagnosis not present

## 2022-09-28 DIAGNOSIS — G43909 Migraine, unspecified, not intractable, without status migrainosus: Secondary | ICD-10-CM | POA: Diagnosis not present

## 2022-09-28 DIAGNOSIS — M5136 Other intervertebral disc degeneration, lumbar region: Secondary | ICD-10-CM | POA: Diagnosis not present

## 2022-09-28 DIAGNOSIS — Z87442 Personal history of urinary calculi: Secondary | ICD-10-CM | POA: Diagnosis not present

## 2022-09-28 DIAGNOSIS — E7800 Pure hypercholesterolemia, unspecified: Secondary | ICD-10-CM | POA: Diagnosis not present

## 2022-09-30 ENCOUNTER — Other Ambulatory Visit: Payer: Self-pay

## 2022-09-30 MED ORDER — NEBIVOLOL HCL 10 MG PO TABS: 10.0000 mg | ORAL_TABLET | Freq: Every day | ORAL | 0 refills | Status: DC

## 2022-10-07 ENCOUNTER — Other Ambulatory Visit: Payer: Self-pay | Admitting: Cardiovascular Disease

## 2022-11-01 DIAGNOSIS — K080 Exfoliation of teeth due to systemic causes: Secondary | ICD-10-CM | POA: Diagnosis not present

## 2022-11-16 DIAGNOSIS — L821 Other seborrheic keratosis: Secondary | ICD-10-CM | POA: Diagnosis not present

## 2022-12-28 DIAGNOSIS — R109 Unspecified abdominal pain: Secondary | ICD-10-CM | POA: Diagnosis not present

## 2023-01-19 ENCOUNTER — Ambulatory Visit: Payer: Medicare Other | Attending: Physician Assistant | Admitting: Physician Assistant

## 2023-01-19 ENCOUNTER — Encounter: Payer: Self-pay | Admitting: Physician Assistant

## 2023-01-19 VITALS — BP 122/62 | HR 62 | Ht 69.0 in | Wt 187.6 lb

## 2023-01-19 DIAGNOSIS — I639 Cerebral infarction, unspecified: Secondary | ICD-10-CM

## 2023-01-19 DIAGNOSIS — R42 Dizziness and giddiness: Secondary | ICD-10-CM

## 2023-01-19 DIAGNOSIS — R6889 Other general symptoms and signs: Secondary | ICD-10-CM

## 2023-01-19 DIAGNOSIS — Z8673 Personal history of transient ischemic attack (TIA), and cerebral infarction without residual deficits: Secondary | ICD-10-CM

## 2023-01-19 LAB — T4, FREE: Free T4: 1.14 ng/dL (ref 0.82–1.77)

## 2023-01-19 LAB — TSH: TSH: 3.52 u[IU]/mL (ref 0.450–4.500)

## 2023-01-19 NOTE — Patient Instructions (Signed)
Medication Instructions:   Your physician recommends that you continue on your current medications as directed. Please refer to the Current Medication list given to you today.   *If you need a refill on your cardiac medications before your next appointment, please call your pharmacy*   Lab Work:   TSH AND FREE T4 TODAY   If you have labs (blood work) drawn today and your tests are completely normal, you will receive your results only by: MyChart Message (if you have MyChart) OR A paper copy in the mail If you have any lab test that is abnormal or we need to change your treatment, we will call you to review the results.   Testing/Procedures:  NEXT WEEK Your physician has requested that you have a carotid duplex. This test is an ultrasound of the carotid arteries in your neck. It looks at blood flow through these arteries that supply the brain with blood. Allow one hour for this exam. There are no restrictions or special instructions.     Follow-Up: At [facility], you and your health needs are our priority.  As part of our continuing mission to provide you with exceptional heart care, we have created designated Provider Care Teams.  These Care Teams include your primary Cardiologist (physician) and Advanced Practice Providers (APPs -  Physician Assistants and Nurse Practitioners) who all work together to provide you with the care you need, when you need it.  We recommend signing up for the patient portal called "MyChart".  Sign up information is provided on this After Visit Summary.  MyChart is used to connect with patients for Virtual Visits (Telemedicine).  Patients are able to view lab/test results, encounter notes, upcoming appointments, etc.  Non-urgent messages can be sent to your provider as well.   To learn more about what you can do with MyChart, go to [email or web address].    Your next appointment:   6 month(s)  Provider:   Thurmon Fair, MD     Other  Instructions

## 2023-01-19 NOTE — Progress Notes (Unsigned)
Cardiology Office Note:  .   Date:  01/20/2023  ID:  Raven [name], DOB [birth year], MRN [id number] PCP: Irena Reichmann, DO  [facility] Providers Cardiologist:  Thurmon Fair, MD     History of Present Illness: .   Raven [name] is a 67 y.o. female with past medical history of idiopathic CVA, hypertension and hyperlipidemia.  She had CVA in April 2017 with MRI showing several small acute posterior left MCA territory cortical and white matter infarcts.  Echocardiogram obtained on 10/29/2015 showed EF 55 to 60%, no regional wall motion abnormality, grade 1 DD, trivial AI, trace MR.  A loop recorder was placed, prior to the battery reached end of service, the loop recorder has been unrevealing.  Last device transmission was from August 2020.  CTA of the head and neck, carotid duplex and TTE also did not show etiology behind the stroke.  She did not have any residual deficit.  Repeat carotid ultrasound obtained on 08/23/2017 showed 1 to 39% bilateral ICA disease.  She was last seen by Dr. Royann Shivers in May 2022 at which time she was doing well.   Patient presents today for delayed 2-year follow-up.  Since the beginning of the year, she has been having episodes where she can hear her pulse on the left side.  This typically occurs when she wake up and try to get up in the morning.  She says that she may have some dizziness, however no feeling of passing out.  There has been no ipsilateral weakness or change in the vision.  Symptom may also occur when she is set down and not doing anything.  However more pronounced right after changing the body position.  She denies any chest pain or shortness of breath.  Blood pressure on arrival was 122/62.  Pulse 62.  EKG is normal.  Suspicion for acute CVA is low.  Will obtain carotid Doppler.  She also describe heat intolerance that occurred around the same time.  I will obtain a TSH.  ROS:   Patient complains of occasional dizziness, she also says she can  hear her pulse on the left side when she wakes up in the morning.  Studies Reviewed: Marland Kitchen   EKG Interpretation Date/Time:  Wednesday January 19 2023 10:20:12 EDT Ventricular Rate:  62 PR Interval:  138 QRS Duration:  86 QT Interval:  392 QTC Calculation: 397 R Axis:   9  Text Interpretation: Normal sinus rhythm Normal ECG Confirmed by Azalee Course [phone]) on 01/19/2023 10:54:53 AM    She denies chest pain, palpitations, dyspnea, pnd, orthopnea, n, v, dizziness, syncope, edema, weight gain, or early satiety. All other systems reviewed and are otherwise negative except as noted above.   Risk Assessment/Calculations:             Physical Exam:   VS:  BP 122/62 (BP Location: Left Arm, Patient Position: Sitting, Cuff Size: Large)   Pulse 62   Ht 5\' 9"  (1.753 m)   Wt 187 lb 9.6 oz (85.1 kg)   SpO2 96%   BMI 27.70 kg/m    Wt Readings from Last 3 Encounters:  01/19/23 187 lb 9.6 oz (85.1 kg)  12/09/20 194 lb 9.6 oz (88.3 kg)  05/08/20 181 lb 4.8 oz (82.2 kg)    GEN: Well nourished, well developed in no acute distress NECK: No JVD; No carotid bruits CARDIAC: RRR, no murmurs, rubs, gallops RESPIRATORY:  Clear to auscultation without rales, wheezing or rhonchi  ABDOMEN: Soft, non-tender, non-distended  EXTREMITIES:  No edema; No deformity   ASSESSMENT AND PLAN: .    Dizziness: Patient has a history of dizziness.  She also complains of a pulsation on her left side that occurs typically in the morning may also occur at other times as well.  I will obtain a carotid Doppler  Heat intolerance: This has been going on for several months.  I will obtain a TSH and free T4  History of CVA: Recent symptom does not sound like neurological event.  I did a full neuroexam, she does not have any focal deficit.  Suspicion for recent acute CVA is low.  Last episode of CVA occurred in 2017       Dispo: Follow-up with Dr. Royann Shivers in 6 months.  Signed, Azalee Course, PA

## 2023-01-26 ENCOUNTER — Ambulatory Visit (HOSPITAL_COMMUNITY)
Admission: RE | Admit: 2023-01-26 | Discharge: 2023-01-26 | Disposition: A | Discharge status: Home / Self Care | Payer: Medicare Other | Source: Ambulatory Visit | Attending: Physician Assistant | Admitting: Physician Assistant

## 2023-01-26 DIAGNOSIS — R42 Dizziness and giddiness: Secondary | ICD-10-CM | POA: Diagnosis not present

## 2023-03-31 DIAGNOSIS — K080 Exfoliation of teeth due to systemic causes: Secondary | ICD-10-CM | POA: Diagnosis not present

## 2023-04-27 DIAGNOSIS — K080 Exfoliation of teeth due to systemic causes: Secondary | ICD-10-CM | POA: Diagnosis not present

## 2023-04-28 DIAGNOSIS — M25561 Pain in right knee: Secondary | ICD-10-CM | POA: Diagnosis not present

## 2023-08-11 ENCOUNTER — Other Ambulatory Visit: Payer: Self-pay

## 2023-08-11 ENCOUNTER — Other Ambulatory Visit: Payer: Self-pay | Admitting: Cardiovascular Disease

## 2023-08-11 DIAGNOSIS — F411 Generalized anxiety disorder: Secondary | ICD-10-CM

## 2023-08-11 MED ORDER — BUSPIRONE HCL 10 MG PO TABS: 10.0000 mg | ORAL_TABLET | Freq: Two times a day (BID) | ORAL | 3 refills | Status: DC

## 2023-08-25 ENCOUNTER — Ambulatory Visit: Payer: Medicare Other | Admitting: Adult Health

## 2023-08-25 ENCOUNTER — Encounter: Payer: Self-pay | Admitting: Adult Health

## 2023-08-25 DIAGNOSIS — F411 Generalized anxiety disorder: Secondary | ICD-10-CM

## 2023-08-25 MED ORDER — HYDROXYZINE HCL 10 MG PO TABS: 10.0000 mg | ORAL_TABLET | Freq: Four times a day (QID) | ORAL | 3 refills | Status: AC | PRN

## 2023-08-25 MED ORDER — BUSPIRONE HCL 10 MG PO TABS: 10.0000 mg | ORAL_TABLET | Freq: Two times a day (BID) | ORAL | 3 refills | Status: DC

## 2023-08-25 NOTE — Progress Notes (Signed)
 Raven [name] [id number] [birth year] 68 y.o.  Virtual Visit via Telephone Note  I connected with pt on 08/25/23 at  8:00 AM EST by telephone and verified that I am speaking with the correct person using two identifiers.   I discussed the limitations, risks, security and privacy concerns of performing an evaluation and management service by telephone and the availability of in person appointments. I also discussed with the patient that there may be a patient responsible charge related to this service. The patient expressed understanding and agreed to proceed.   I discussed the assessment and treatment plan with the patient. The patient was provided an opportunity to ask questions and all were answered. The patient agreed with the plan and demonstrated an understanding of the instructions.   The patient was advised to call back or seek an in-person evaluation if the symptoms worsen or if the condition fails to improve as anticipated.  I provided 15 minutes of non-face-to-face time during this encounter.  The patient was located at home.  The provider was located at [facility].   Raven Raven Sayers, NP   Subjective:   Patient ID:  Raven [name] is a 68 y.o. (DOB July 14, 1956) female.  Chief Complaint: No chief complaint on file.   HPI Raven [name] presents for follow-up of GAD.  Describes mood today as ok. Pleasant. Mood symptoms - denies depression. Reports stable interest and motivation. Reports irritability sometimes. Reports decreased anxiety - for the most part, it's at bay. Denies panic attacks. Denies worry, rumination and over thinking. Mood is consistent. Stating I have mostly good days. Feels like Buspar  continues to work well. Using Hydroxyzine  as needed. Taking medications as prescribed.  Energy levels stable. Active, has a regular exercise routine.  Enjoys some usual interests and activities. Married. Lives with husband and 2 dogs. Has 3  daughters and 1 son - 5 grandchildren. Spending time with family.  Appetite adequate. Weight stable. Sleeps well most nights. Averages 8 hours.   Focus and concentration stable. Completing tasks. Managing aspects of household. Retired. Denies SI or HI.  Denies AH or VH. Denies self harm. Denies substance use. Therapist - Raven [name].  Previous medication trials: Buspar , Hydroxyzine    Review of Systems:  Review of Systems  Musculoskeletal:  Negative for gait problem.  Neurological:  Negative for tremors.  Psychiatric/Behavioral:         Please refer to HPI    Medications: I have reviewed the patient's current medications.  Current Outpatient Medications  Medication Sig Dispense Refill   amLODipine  (NORVASC ) 2.5 MG tablet TAKE 1 TABLET BY MOUTH EVERY DAY 90 tablet 3   Ascorbic Acid (VITAMIN C) 1000 MG tablet Take 1,000 mg by mouth every evening.     aspirin  325 MG EC tablet Take 1 tablet (325 mg total) by mouth daily. 30 tablet 1   atorvastatin  (LIPITOR) 10 MG tablet TAKE 1 TABLET (10 MG TOTAL) BY MOUTH DAILY AT 6 PM. 90 tablet 3   busPIRone  (BUSPAR ) 10 MG tablet Take 1 tablet (10 mg total) by mouth 2 (two) times daily. 180 tablet 3   Calcium  Carbonate-Vitamin D (CALCIUM  + D PO) Take 1 tablet by mouth every evening.     ciprofloxacin  (CIPRO ) 500 MG tablet Take 500 mg by mouth 2 (two) times daily.     esomeprazole (NEXIUM) 20 MG capsule Take 20 mg by mouth daily at 12 noon.     estrogens, conjugated, (PREMARIN) 0.3 MG tablet Take 0.625 mg by mouth  daily. Take daily for 21 days then do not take for 7 days.     estrogens, conjugated, (PREMARIN) 0.3 MG tablet Take 0.3 mg by mouth daily.     hydrOXYzine  (ATARAX /VISTARIL ) 10 MG tablet Take 1 tablet (10 mg total) by mouth every 6 (six) hours as needed. 30 tablet 5   Multiple Vitamin (MULTIVITAMIN PO) Take 1 tablet by mouth daily in the afternoon.     nebivolol  (BYSTOLIC ) 10 MG tablet TAKE 1 TABLET BY MOUTH DAILY 90 tablet 1    promethazine  (PHENERGAN ) 25 MG suppository Place 1 suppository (25 mg total) rectally every 6 (six) hours as needed for nausea. 12 suppository 3   SUMAtriptan  (IMITREX ) 100 MG tablet TAKE 1 TAB BY MOUTH EVERY 2 HOURS AS NEEDED FOR MIGRAINE MAY REPEAT IN 2 HOURS IF PERSISTS OR RECURS 18 tablet 11   No current facility-administered medications for this visit.    Medication Side Effects: None  Allergies:  Allergies  Allergen Reactions   Codeine Nausea Only, Other (See Comments) and Nausea And Vomiting    not well tolerated   Morphine Nausea Only and Other (See Comments)    not well tolerated    Morphine And Codeine Nausea Only and Other (See Comments)    not well tolerated   Sulfa Antibiotics Itching, Hives and Rash    Past Medical History:  Diagnosis Date   Abnormal Pap smear 08/2005   Cancer (HCC)    cervical   Diverticulosis    History of migraines    Hypertension    Kidney stones 2012   Menopause    Migraine    PFO (patent foramen ovale)    PONV (postoperative nausea and vomiting)    Seasonal allergies    Stroke [facility])     Family History  Problem Relation Age of Onset   Heart disease Mother        heart attack   Heart disease Father        heart attack   Hypertension Father     Social History   Socioeconomic History   Marital status: Married    Spouse name: Not on file   Number of children: Not on file   Years of education: Not on file   Highest education level: Not on file  Occupational History   Not on file  Tobacco Use   Smoking status: Never   Smokeless tobacco: Never  Vaping Use   Vaping status: Never Used  Substance and Sexual Activity   Alcohol use: Yes    Alcohol/week: 1.0 standard drink of alcohol    Types: 1 Glasses of wine per week    Comment: rare / wine   Drug use: No   Sexual activity: Not on file  Other Topics Concern   Not on file  Social History Narrative   Not on file   Social Drivers of Health   Financial Resource  Strain: Not on file  Food Insecurity: Not on file  Transportation Needs: Not on file  Physical Activity: Not on file  Stress: Not on file  Social Connections: Not on file  Intimate Partner Violence: Not on file    Past Medical History, Surgical history, Social history, and Family history were reviewed and updated as appropriate.   Please see review of systems for further details on the patient's review from today.   Objective:   Physical Exam:  There were no vitals taken for this visit.  Physical Exam Constitutional:      General:  She is not in acute distress. Musculoskeletal:        General: No deformity.  Neurological:     Mental Status: She is alert and oriented to person, place, and time.     Coordination: Coordination normal.  Psychiatric:        Attention and Perception: Attention and perception normal. She does not perceive auditory or visual hallucinations.        Mood and Affect: Affect is not labile, blunt, angry or inappropriate.        Speech: Speech normal.        Behavior: Behavior normal.        Thought Content: Thought content normal. Thought content is not paranoid or delusional. Thought content does not include homicidal or suicidal ideation. Thought content does not include homicidal or suicidal plan.        Cognition and Memory: Cognition and memory normal.        Judgment: Judgment normal.     Comments: Insight intact     Lab Review:     Component Value Date/Time   NA 141 10/31/2017 0937   K 3.8 10/31/2017 0937   CL 103 10/31/2017 0937   CO2 23 10/31/2017 0937   GLUCOSE 76 10/31/2017 0937   GLUCOSE 85 10/21/2015 0343   BUN 13 10/31/2017 0937   CREATININE 1.13 (H) 10/31/2017 0937   CALCIUM  9.4 10/31/2017 0937   PROT 6.7 10/31/2017 0937   ALBUMIN 4.3 10/31/2017 0937   AST 18 10/31/2017 0937   ALT 16 10/31/2017 0937   ALKPHOS 99 10/31/2017 0937   BILITOT 0.6 10/31/2017 0937   GFRNONAA 53 (L) 10/31/2017 0937   GFRAA 61 10/31/2017 0937        Component Value Date/Time   WBC 5.9 10/21/2015 0343   RBC 4.32 10/21/2015 0343   HGB 13.0 10/21/2015 0343   HCT 39.4 10/21/2015 0343   PLT 164 10/21/2015 0343   MCV 91.2 10/21/2015 0343   MCH 30.1 10/21/2015 0343   MCHC 33.0 10/21/2015 0343   RDW 12.8 10/21/2015 0343   LYMPHSABS 2.0 10/21/2015 0343   MONOABS 0.5 10/21/2015 0343   EOSABS 0.1 10/21/2015 0343   BASOSABS 0.0 10/21/2015 0343    No results found for: POCLITH, LITHIUM   No results found for: PHENYTOIN, PHENOBARB, VALPROATE, CBMZ   .res Assessment: Plan:    Plan:  PDMP reviewed  1. Buspar  10mg  BID - none needed today - will call for refills.  2. Hydroxyzine  10mg  daily as needed.  RTC 1 year.  15 minutes spent dedicated to the care of this patient on the date of this encounter to include pre-visit review of records, ordering of medication, post visit documentation, and face-to-face time with the patient discussing GAD. Discussed continuing current medication regimen.  Patient advised to contact office with any questions, adverse effects, or acute worsening in signs and symptoms.  There are no diagnoses linked to this encounter.  Please see After Visit Summary for patient specific instructions.  Future Appointments  Date Time Provider Department Center  08/25/2023  8:00 AM Mayola Mcbain Nattalie, NP [facility] None    No orders of the defined types were placed in this encounter.     -------------------------------

## 2023-09-08 ENCOUNTER — Telehealth: Payer: Self-pay

## 2023-09-08 NOTE — Telephone Encounter (Signed)
Prior authorization submitted for Hydroxyzine 10 mg tablets with [employer] Medicare, pending response

## 2023-09-09 NOTE — Telephone Encounter (Signed)
Prior approval received for Hydroxyzine 10 mg tablet effective through 09/07/24 with [facility] Medicare

## 2023-09-27 DIAGNOSIS — R7989 Other specified abnormal findings of blood chemistry: Secondary | ICD-10-CM | POA: Diagnosis not present

## 2023-09-27 DIAGNOSIS — Z862 Personal history of diseases of the blood and blood-forming organs and certain disorders involving the immune mechanism: Secondary | ICD-10-CM | POA: Diagnosis not present

## 2023-09-27 DIAGNOSIS — E7800 Pure hypercholesterolemia, unspecified: Secondary | ICD-10-CM | POA: Diagnosis not present

## 2023-09-27 DIAGNOSIS — N1831 Chronic kidney disease, stage 3a: Secondary | ICD-10-CM | POA: Diagnosis not present

## 2023-09-27 DIAGNOSIS — I129 Hypertensive chronic kidney disease with stage 1 through stage 4 chronic kidney disease, or unspecified chronic kidney disease: Secondary | ICD-10-CM | POA: Diagnosis not present

## 2023-09-27 DIAGNOSIS — E559 Vitamin D deficiency, unspecified: Secondary | ICD-10-CM | POA: Diagnosis not present

## 2023-10-04 DIAGNOSIS — Z780 Asymptomatic menopausal state: Secondary | ICD-10-CM | POA: Diagnosis not present

## 2023-10-04 DIAGNOSIS — Z0000 Encounter for general adult medical examination without abnormal findings: Secondary | ICD-10-CM | POA: Diagnosis not present

## 2023-11-21 ENCOUNTER — Other Ambulatory Visit: Payer: Self-pay | Admitting: Obstetrics and Gynecology

## 2023-11-21 DIAGNOSIS — Z1231 Encounter for screening mammogram for malignant neoplasm of breast: Secondary | ICD-10-CM

## 2023-11-24 ENCOUNTER — Ambulatory Visit
Admission: RE | Admit: 2023-11-24 | Discharge: 2023-11-24 | Disposition: A | Discharge status: Home / Self Care | Source: Ambulatory Visit

## 2023-11-24 DIAGNOSIS — Z1231 Encounter for screening mammogram for malignant neoplasm of breast: Secondary | ICD-10-CM | POA: Diagnosis not present

## 2023-12-08 DIAGNOSIS — K080 Exfoliation of teeth due to systemic causes: Secondary | ICD-10-CM | POA: Diagnosis not present

## 2023-12-27 ENCOUNTER — Other Ambulatory Visit: Payer: Self-pay | Admitting: Cardiovascular Disease

## 2024-01-17 DIAGNOSIS — Z01419 Encounter for gynecological examination (general) (routine) without abnormal findings: Secondary | ICD-10-CM | POA: Diagnosis not present

## 2024-01-17 DIAGNOSIS — C539 Malignant neoplasm of cervix uteri, unspecified: Secondary | ICD-10-CM | POA: Diagnosis not present

## 2024-02-07 DIAGNOSIS — R252 Cramp and spasm: Secondary | ICD-10-CM | POA: Diagnosis not present

## 2024-02-07 DIAGNOSIS — R6889 Other general symptoms and signs: Secondary | ICD-10-CM | POA: Diagnosis not present

## 2024-02-21 ENCOUNTER — Other Ambulatory Visit: Payer: Self-pay | Admitting: Cardiovascular Disease

## 2024-04-11 DIAGNOSIS — K080 Exfoliation of teeth due to systemic causes: Secondary | ICD-10-CM | POA: Diagnosis not present

## 2024-04-20 ENCOUNTER — Encounter: Payer: Self-pay | Admitting: Adult Health

## 2024-04-20 ENCOUNTER — Ambulatory Visit: Admitting: Adult Health

## 2024-04-20 DIAGNOSIS — F411 Generalized anxiety disorder: Secondary | ICD-10-CM

## 2024-04-20 MED ORDER — SERTRALINE HCL 50 MG PO TABS: 50.0000 mg | ORAL_TABLET | Freq: Every day | ORAL | 2 refills | Status: DC

## 2024-04-20 NOTE — Progress Notes (Signed)
 Raven [name] [id number] July 17, 1956 68 y.o.  Virtual Visit via Telephone Note  I connected with pt on 04/20/24 at 11:30 AM EDT by telephone and verified that I am speaking with the correct person using two identifiers.   I discussed the limitations, risks, security and privacy concerns of performing an evaluation and management service by telephone and the availability of in person appointments. I also discussed with the patient that there may be a patient responsible charge related to this service. The patient expressed understanding and agreed to proceed.   I discussed the assessment and treatment plan with the patient. The patient was provided an opportunity to ask questions and all were answered. The patient agreed with the plan and demonstrated an understanding of the instructions.   The patient was advised to call back or seek an in-person evaluation if the symptoms worsen or if the condition fails to improve as anticipated.  I provided 25 minutes of non-face-to-face time during this encounter.  The patient was located at home.  The provider was located at [facility].   Angeline LOISE Sayers, NP   Subjective:   Patient ID:  Raven [name] is a 68 y.o. (DOB Oct 04, 1955) female.  Chief Complaint: No chief complaint on file.   HPI MARCE SCHARTZ presents for follow-up of GAD.  Describes mood today as not the best. Pleasant. Mood symptoms - reports some elements of depression, but feels more like it's anxiety. Reports irritability I feel like like I'm short with people. Reports decreased interest and motivation - having to push myself. Denies panic attacks. Reports some worry, rumination and over thinking. Reports mood is lower. Stating all in all, I feel like I'm ok. Does not feel like the Buspar  is working as well as it did. Using Hydroxyzine  as needed. Would like to consider other options. Taking medications as prescribed.  Energy levels stable. Active,  has a regular exercise routine.  Enjoys some usual interests and activities. Married. Lives with husband and 1 dog. Has 3 daughters and 1 son - 5 grandchildren. Spending time with family.  Appetite adequate. Weight stable. Reports sleep has become a problem. Waking up early in the morning - getting up and doing things. Averages 8 hours mostly.   Focus and concentration stable. Completing tasks. Managing aspects of household. Retired. Denies SI or HI.  Denies AH or VH. Denies self harm. Denies substance use.   Previous medication trials: Buspar , Hydroxyzine    Review of Systems:  Review of Systems  Musculoskeletal:  Negative for gait problem.  Neurological:  Negative for tremors.  Psychiatric/Behavioral:         Please refer to HPI    Medications: I have reviewed the patient's current medications.  Current Outpatient Medications  Medication Sig Dispense Refill   amLODipine  (NORVASC ) 2.5 MG tablet TAKE 1 TABLET BY MOUTH EVERY DAY 90 tablet 3   Ascorbic Acid (VITAMIN C) 1000 MG tablet Take 1,000 mg by mouth every evening.     aspirin  325 MG EC tablet Take 1 tablet (325 mg total) by mouth daily. 30 tablet 1   atorvastatin  (LIPITOR) 10 MG tablet TAKE 1 TABLET (10 MG TOTAL) BY MOUTH DAILY AT 6 PM. 90 tablet 3   busPIRone  (BUSPAR ) 10 MG tablet Take 1 tablet (10 mg total) by mouth 2 (two) times daily. 180 tablet 3   Calcium  Carbonate-Vitamin D (CALCIUM  + D PO) Take 1 tablet by mouth every evening.     ciprofloxacin  (CIPRO ) 500 MG tablet Take 500  mg by mouth 2 (two) times daily.     esomeprazole (NEXIUM) 20 MG capsule Take 20 mg by mouth daily at 12 noon.     estrogens, conjugated, (PREMARIN) 0.3 MG tablet Take 0.625 mg by mouth daily. Take daily for 21 days then do not take for 7 days.     estrogens, conjugated, (PREMARIN) 0.3 MG tablet Take 0.3 mg by mouth daily.     hydrOXYzine  (ATARAX ) 10 MG tablet Take 1 tablet (10 mg total) by mouth every 6 (six) hours as needed. 90 tablet 3    Multiple Vitamin (MULTIVITAMIN PO) Take 1 tablet by mouth daily in the afternoon.     nebivolol  (BYSTOLIC ) 10 MG tablet Take 1 tablet (10 mg total) by mouth daily. 90 tablet 0   promethazine  (PHENERGAN ) 25 MG suppository Place 1 suppository (25 mg total) rectally every 6 (six) hours as needed for nausea. 12 suppository 3   SUMAtriptan  (IMITREX ) 100 MG tablet TAKE 1 TAB BY MOUTH EVERY 2 HOURS AS NEEDED FOR MIGRAINE MAY REPEAT IN 2 HOURS IF PERSISTS OR RECURS 18 tablet 11   No current facility-administered medications for this visit.    Medication Side Effects: None  Allergies:  Allergies  Allergen Reactions   Codeine Nausea Only, Other (See Comments) and Nausea And Vomiting    not well tolerated   Morphine Nausea Only and Other (See Comments)    not well tolerated    Morphine And Codeine Nausea Only and Other (See Comments)    not well tolerated   Sulfa Antibiotics Itching, Hives and Rash    Past Medical History:  Diagnosis Date   Abnormal Pap smear 08/2005   Cancer (HCC)    cervical   Diverticulosis    History of migraines    Hypertension    Kidney stones 2012   Menopause    Migraine    PFO (patent foramen ovale)    PONV (postoperative nausea and vomiting)    Seasonal allergies    Stroke [facility])     Family History  Problem Relation Age of Onset   Heart disease Mother        heart attack   Heart disease Father        heart attack   Hypertension Father    Breast cancer Neg Hx     Social History   Socioeconomic History   Marital status: Married    Spouse name: Not on file   Number of children: Not on file   Years of education: Not on file   Highest education level: Not on file  Occupational History   Not on file  Tobacco Use   Smoking status: Never   Smokeless tobacco: Never  Vaping Use   Vaping status: Never Used  Substance and Sexual Activity   Alcohol use: Yes    Alcohol/week: 1.0 standard drink of alcohol    Types: 1 Glasses of wine per week     Comment: rare / wine   Drug use: No   Sexual activity: Not on file  Other Topics Concern   Not on file  Social History Narrative   Not on file   Social Drivers of Health   Financial Resource Strain: Not on file  Food Insecurity: Not on file  Transportation Needs: Not on file  Physical Activity: Not on file  Stress: Not on file  Social Connections: Not on file  Intimate Partner Violence: Not on file    Past Medical History, Surgical history, Social history, and  Family history were reviewed and updated as appropriate.   Please see review of systems for further details on the patient's review from today.   Objective:   Physical Exam:  There were no vitals taken for this visit.  Physical Exam Constitutional:      General: She is not in acute distress. Musculoskeletal:        General: No deformity.  Neurological:     Mental Status: She is alert and oriented to person, place, and time.     Coordination: Coordination normal.  Psychiatric:        Attention and Perception: Attention and perception normal. She does not perceive auditory or visual hallucinations.        Mood and Affect: Mood normal. Mood is not anxious or depressed. Affect is not labile, blunt, angry or inappropriate.        Speech: Speech normal.        Behavior: Behavior normal.        Thought Content: Thought content normal. Thought content is not paranoid or delusional. Thought content does not include homicidal or suicidal ideation. Thought content does not include homicidal or suicidal plan.        Cognition and Memory: Cognition and memory normal.        Judgment: Judgment normal.     Comments: Insight intact     Lab Review:     Component Value Date/Time   NA 141 10/31/2017 0937   K 3.8 10/31/2017 0937   CL 103 10/31/2017 0937   CO2 23 10/31/2017 0937   GLUCOSE 76 10/31/2017 0937   GLUCOSE 85 10/21/2015 0343   BUN 13 10/31/2017 0937   CREATININE 1.13 (H) 10/31/2017 0937   CALCIUM  9.4 10/31/2017  0937   PROT 6.7 10/31/2017 0937   ALBUMIN 4.3 10/31/2017 0937   AST 18 10/31/2017 0937   ALT 16 10/31/2017 0937   ALKPHOS 99 10/31/2017 0937   BILITOT 0.6 10/31/2017 0937   GFRNONAA 53 (L) 10/31/2017 0937   GFRAA 61 10/31/2017 0937       Component Value Date/Time   WBC 5.9 10/21/2015 0343   RBC 4.32 10/21/2015 0343   HGB 13.0 10/21/2015 0343   HCT 39.4 10/21/2015 0343   PLT 164 10/21/2015 0343   MCV 91.2 10/21/2015 0343   MCH 30.1 10/21/2015 0343   MCHC 33.0 10/21/2015 0343   RDW 12.8 10/21/2015 0343   LYMPHSABS 2.0 10/21/2015 0343   MONOABS 0.5 10/21/2015 0343   EOSABS 0.1 10/21/2015 0343   BASOSABS 0.0 10/21/2015 0343    No results found for: POCLITH, LITHIUM   No results found for: PHENYTOIN, PHENOBARB, VALPROATE, CBMZ   .res Assessment: Plan:    Plan:  PDMP reviewed  Add Zoloft  50mg  - 1/2 tablet daily x 7 days, then increase one tablet D/C Buspar  10mg  BID  Hydroxyzine  10mg  daily as needed.  RTC 4 weeks  25 minutes spent dedicated to the care of this patient on the date of this encounter to include pre-visit review of records, ordering of medication, post visit documentation, and face-to-face time with the patient discussing GAD. Discussed continuing current medication regimen.  Patient advised to contact office with any questions, adverse effects, or acute worsening in signs and symptoms.  There are no diagnoses linked to this encounter.  Please see After Visit Summary for patient specific instructions.  Future Appointments  Date Time Provider Department Center  04/20/2024 11:30 AM Ladaysha Soutar Nattalie, NP [facility] None    No orders of the  defined types were placed in this encounter.     -------------------------------

## 2024-05-16 ENCOUNTER — Other Ambulatory Visit: Payer: Self-pay | Admitting: Cardiovascular Disease

## 2024-06-11 ENCOUNTER — Encounter: Payer: Self-pay | Admitting: Adult Health

## 2024-06-11 ENCOUNTER — Telehealth: Admitting: Adult Health

## 2024-06-11 DIAGNOSIS — F411 Generalized anxiety disorder: Secondary | ICD-10-CM

## 2024-06-11 MED ORDER — SERTRALINE HCL 50 MG PO TABS: 50.0000 mg | ORAL_TABLET | Freq: Two times a day (BID) | ORAL | 2 refills | Status: DC

## 2024-06-11 NOTE — Progress Notes (Signed)
 Raven [name] [id number] January 03, 1956 68 y.o.  Virtual Visit via Video Note  I connected with pt @ on 06/11/24 at  3:00 PM EST by a video enabled telemedicine application and verified that I am speaking with the correct person using two identifiers.   I discussed the limitations of evaluation and management by telemedicine and the availability of in person appointments. The patient expressed understanding and agreed to proceed.  I discussed the assessment and treatment plan with the patient. The patient was provided an opportunity to ask questions and all were answered. The patient agreed with the plan and demonstrated an understanding of the instructions.   The patient was advised to call back or seek an in-person evaluation if the symptoms worsen or if the condition fails to improve as anticipated.  I provided 25 minutes of non-face-to-face time during this encounter.  The patient was located at home.  The provider was located at [facility].   Angeline LOISE Sayers, NP   Subjective:   Patient ID:  Raven [name] is a 68 y.o. (DOB 07-May-1956) female.  Chief Complaint: No chief complaint on file.   HPI Raven [name] presents for follow-up of GAD.  Describes mood today as better. Pleasant. Mood symptoms - reports decreased depression, anxiety and irritability. Reports improved interest and motivation. Denies panic attacks. Denies worry, rumination and over thinking. Reports mood is improved. Stating I feel like I'm doing better. Feels like the addition of Zoloft  has been helpful - it's taken some adjusting. Would like to consider increasing dose. Has tapered off the Buspar . Taking medications as prescribed.  Energy levels stable. Active, has a regular exercise routine.  Enjoys some usual interests and activities. Married. Lives with husband and 1 dog. Has 3 daughters and 1 son - 5 grandchildren. Spending time with family.  Appetite adequate. Weight  stable. Reports sleep has improved. Averages 8 hours. Focus and concentration stable. Completing tasks. Managing aspects of household. Retired. Denies SI or HI.  Denies AH or VH. Denies self harm. Denies substance use.  Previous medication trials: Buspar , Hydroxyzine    Review of Systems:  Review of Systems  Musculoskeletal:  Negative for gait problem.  Neurological:  Negative for tremors.  Psychiatric/Behavioral:         Please refer to HPI    Medications: I have reviewed the patient's current medications.  Current Outpatient Medications  Medication Sig Dispense Refill   amLODipine  (NORVASC ) 2.5 MG tablet TAKE 1 TABLET BY MOUTH EVERY DAY 90 tablet 3   Ascorbic Acid (VITAMIN C) 1000 MG tablet Take 1,000 mg by mouth every evening.     aspirin  325 MG EC tablet Take 1 tablet (325 mg total) by mouth daily. 30 tablet 1   atorvastatin  (LIPITOR) 10 MG tablet TAKE 1 TABLET (10 MG TOTAL) BY MOUTH DAILY AT 6 PM. 90 tablet 3   busPIRone  (BUSPAR ) 10 MG tablet Take 1 tablet (10 mg total) by mouth 2 (two) times daily. 180 tablet 3   Calcium  Carbonate-Vitamin D (CALCIUM  + D PO) Take 1 tablet by mouth every evening.     ciprofloxacin  (CIPRO ) 500 MG tablet Take 500 mg by mouth 2 (two) times daily.     esomeprazole (NEXIUM) 20 MG capsule Take 20 mg by mouth daily at 12 noon.     estrogens, conjugated, (PREMARIN) 0.3 MG tablet Take 0.625 mg by mouth daily. Take daily for 21 days then do not take for 7 days.     estrogens, conjugated, (PREMARIN) 0.3 MG  tablet Take 0.3 mg by mouth daily.     hydrOXYzine  (ATARAX ) 10 MG tablet Take 1 tablet (10 mg total) by mouth every 6 (six) hours as needed. 90 tablet 3   Multiple Vitamin (MULTIVITAMIN PO) Take 1 tablet by mouth daily in the afternoon.     nebivolol  (BYSTOLIC ) 10 MG tablet TAKE 1 TABLET BY MOUTH DAILY 30 tablet 0   promethazine  (PHENERGAN ) 25 MG suppository Place 1 suppository (25 mg total) rectally every 6 (six) hours as needed for nausea. 12  suppository 3   sertraline  (ZOLOFT ) 50 MG tablet Take 1 tablet (50 mg total) by mouth daily. 30 tablet 2   SUMAtriptan  (IMITREX ) 100 MG tablet TAKE 1 TAB BY MOUTH EVERY 2 HOURS AS NEEDED FOR MIGRAINE MAY REPEAT IN 2 HOURS IF PERSISTS OR RECURS 18 tablet 11   No current facility-administered medications for this visit.    Medication Side Effects: None  Allergies:  Allergies  Allergen Reactions   Codeine Nausea Only, Other (See Comments) and Nausea And Vomiting    not well tolerated   Morphine Nausea Only and Other (See Comments)    not well tolerated    Morphine And Codeine Nausea Only and Other (See Comments)    not well tolerated   Sulfa Antibiotics Itching, Hives and Rash    Past Medical History:  Diagnosis Date   Abnormal Pap smear 08/2005   Cancer (HCC)    cervical   Diverticulosis    History of migraines    Hypertension    Kidney stones 2012   Menopause    Migraine    PFO (patent foramen ovale)    PONV (postoperative nausea and vomiting)    Seasonal allergies    Stroke [facility])     Family History  Problem Relation Age of Onset   Heart disease Mother        heart attack   Heart disease Father        heart attack   Hypertension Father    Breast cancer Neg Hx     Social History   Socioeconomic History   Marital status: Married    Spouse name: Not on file   Number of children: Not on file   Years of education: Not on file   Highest education level: Not on file  Occupational History   Not on file  Tobacco Use   Smoking status: Never   Smokeless tobacco: Never  Vaping Use   Vaping status: Never Used  Substance and Sexual Activity   Alcohol use: Yes    Alcohol/week: 1.0 standard drink of alcohol    Types: 1 Glasses of wine per week    Comment: rare / wine   Drug use: No   Sexual activity: Not on file  Other Topics Concern   Not on file  Social History Narrative   Not on file   Social Drivers of Health   Financial Resource Strain: Not on  file  Food Insecurity: Not on file  Transportation Needs: Not on file  Physical Activity: Not on file  Stress: Not on file  Social Connections: Not on file  Intimate Partner Violence: Not on file    Past Medical History, Surgical history, Social history, and Family history were reviewed and updated as appropriate.   Please see review of systems for further details on the patient's review from today.   Objective:   Physical Exam:  There were no vitals taken for this visit.  Physical Exam Constitutional:  General: She is not in acute distress. Musculoskeletal:        General: No deformity.  Neurological:     Mental Status: She is alert and oriented to person, place, and time.     Coordination: Coordination normal.  Psychiatric:        Attention and Perception: Attention and perception normal. She does not perceive auditory or visual hallucinations.        Mood and Affect: Mood normal. Mood is not anxious or depressed. Affect is not labile, blunt, angry or inappropriate.        Speech: Speech normal.        Behavior: Behavior normal.        Thought Content: Thought content normal. Thought content is not paranoid or delusional. Thought content does not include homicidal or suicidal ideation. Thought content does not include homicidal or suicidal plan.        Cognition and Memory: Cognition and memory normal.        Judgment: Judgment normal.     Comments: Insight intact     Lab Review:     Component Value Date/Time   NA 141 10/31/2017 0937   K 3.8 10/31/2017 0937   CL 103 10/31/2017 0937   CO2 23 10/31/2017 0937   GLUCOSE 76 10/31/2017 0937   GLUCOSE 85 10/21/2015 0343   BUN 13 10/31/2017 0937   CREATININE 1.13 (H) 10/31/2017 0937   CALCIUM  9.4 10/31/2017 0937   PROT 6.7 10/31/2017 0937   ALBUMIN 4.3 10/31/2017 0937   AST 18 10/31/2017 0937   ALT 16 10/31/2017 0937   ALKPHOS 99 10/31/2017 0937   BILITOT 0.6 10/31/2017 0937   GFRNONAA 53 (L) 10/31/2017 0937    GFRAA 61 10/31/2017 0937       Component Value Date/Time   WBC 5.9 10/21/2015 0343   RBC 4.32 10/21/2015 0343   HGB 13.0 10/21/2015 0343   HCT 39.4 10/21/2015 0343   PLT 164 10/21/2015 0343   MCV 91.2 10/21/2015 0343   MCH 30.1 10/21/2015 0343   MCHC 33.0 10/21/2015 0343   RDW 12.8 10/21/2015 0343   LYMPHSABS 2.0 10/21/2015 0343   MONOABS 0.5 10/21/2015 0343   EOSABS 0.1 10/21/2015 0343   BASOSABS 0.0 10/21/2015 0343    No results found for: POCLITH, LITHIUM   No results found for: PHENYTOIN, PHENOBARB, VALPROATE, CBMZ   .res Assessment: Plan:    Plan:  PDMP reviewed  Increase Zoloft  50mg  to 100mg  daily - sent in 50mg  BID for tapering purposes.  Hydroxyzine  10mg  daily as needed.  RTC 6 weeks  25 minutes spent dedicated to the care of this patient on the date of this encounter to include pre-visit review of records, ordering of medication, post visit documentation, and face-to-face time with the patient discussing GAD. Discussed increasing the Zoloft  50mg  to 100mg  daily.  Patient advised to contact office with any questions, adverse effects, or acute worsening in signs and symptoms.  There are no diagnoses linked to this encounter.   Please see After Visit Summary for patient specific instructions.  Future Appointments  Date Time Provider Department Center  06/11/2024  3:00 PM Jessy Cybulski Nattalie, NP CP-[facility] None    No orders of the defined types were placed in this encounter.     -------------------------------

## 2024-06-22 ENCOUNTER — Telehealth: Payer: Self-pay

## 2024-06-22 NOTE — Telephone Encounter (Signed)
 PA Sertraline  50 mg #60/30 day approved 06/21/24-06/21/25 [employer]

## 2024-06-27 ENCOUNTER — Telehealth: Payer: Self-pay | Admitting: Adult Health

## 2024-06-27 NOTE — Telephone Encounter (Signed)
 Pt has RF available. Verified with pharmacy and notified them of PA approval. They said they would contact patient with ready.

## 2024-06-27 NOTE — Telephone Encounter (Signed)
 Pt called at 9:21a stating she was notified the Sertraline  was approved.  She said the pharmacy needs the script sent back to them to fill it.  Confirmed pharmacy is   [id number] [place], [place] - [phone] AT [facility] OF [facility] & [address] [place] [address] Phone: [phone]  Fax: [phone]   Next appt 1/6

## 2024-06-29 ENCOUNTER — Other Ambulatory Visit: Payer: Self-pay | Admitting: Physician Assistant

## 2024-07-24 ENCOUNTER — Telehealth: Admitting: Adult Health

## 2024-07-24 ENCOUNTER — Encounter: Payer: Self-pay | Admitting: Adult Health

## 2024-07-24 DIAGNOSIS — F411 Generalized anxiety disorder: Secondary | ICD-10-CM | POA: Diagnosis not present

## 2024-07-24 MED ORDER — SERTRALINE HCL 50 MG PO TABS: 50.0000 mg | ORAL_TABLET | Freq: Two times a day (BID) | ORAL | 1 refills | Status: AC

## 2024-07-24 NOTE — Progress Notes (Signed)
 Raven [name] [id number] June 01, 1956 69 y.o.  Virtual Visit via Video Note  I connected with pt @ on 07/24/2024 at  1:30 PM EST by a video enabled telemedicine application and verified that I am speaking with the correct person using two identifiers.   I discussed the limitations of evaluation and management by telemedicine and the availability of in person appointments. The patient expressed understanding and agreed to proceed.  I discussed the assessment and treatment plan with the patient. The patient was provided an opportunity to ask questions and all were answered. The patient agreed with the plan and demonstrated an understanding of the instructions.   The patient was advised to call back or seek an in-person evaluation if the symptoms worsen or if the condition fails to improve as anticipated.  I provided 15 minutes of non-face-to-face time during this encounter.  The patient was located at home.  The provider was located at [facility].   Raven Raven Sayers, NP   Subjective:   Patient ID:  Raven [name] is a 69 y.o. (DOB 05-Mar-1956) female.  Chief Complaint: No chief complaint on file.   HPI Raven [name] presents for follow-up of GAD.  Describes mood today as better. Pleasant. Mood symptoms - denies depression. Reports decreased anxiety. Reports irritability at times - nothing abnormal. Reports stable interest and motivation. Denies panic attacks. Denies worry, rumination and over thinking. Reports mood is stable. Stating I feel like I'm doing ok. Feels like the addition of Zoloft  has been helpful. Taking medications as prescribed.  Energy levels stable. Active, has a regular exercise routine.  Enjoys some usual interests and activities. Married. Lives with husband and 1 dog. Has 3 daughters and 1 son - 5 grandchildren. Spending time with family.  Appetite adequate. Weight loss 18 pounds over the 5 to 6 months - plans to follow up with  PCP. Reports sleep has improved. Averages 8 hours. Focus and concentration stable. Completing tasks. Managing aspects of household. Retired. Denies SI or HI.  Denies AH or VH. Denies self harm. Denies substance use.  Previous medication trials: Buspar , Hydroxyzine    Review of Systems:  Review of Systems  Musculoskeletal:  Negative for gait problem.  Neurological:  Negative for tremors.  Psychiatric/Behavioral:         Please refer to HPI   Medications: I have reviewed the patient's current medications.  Current Outpatient Medications  Medication Sig Dispense Refill   amLODipine  (NORVASC ) 2.5 MG tablet TAKE 1 TABLET BY MOUTH EVERY DAY 90 tablet 3   Ascorbic Acid (VITAMIN C) 1000 MG tablet Take 1,000 mg by mouth every evening.     aspirin  325 MG EC tablet Take 1 tablet (325 mg total) by mouth daily. 30 tablet 1   atorvastatin  (LIPITOR) 10 MG tablet TAKE 1 TABLET (10 MG TOTAL) BY MOUTH DAILY AT 6 PM. 90 tablet 3   Calcium  Carbonate-Vitamin D (CALCIUM  + D PO) Take 1 tablet by mouth every evening.     ciprofloxacin  (CIPRO ) 500 MG tablet Take 500 mg by mouth 2 (two) times daily.     esomeprazole (NEXIUM) 20 MG capsule Take 20 mg by mouth daily at 12 noon.     estrogens, conjugated, (PREMARIN) 0.3 MG tablet Take 0.625 mg by mouth daily. Take daily for 21 days then do not take for 7 days.     estrogens, conjugated, (PREMARIN) 0.3 MG tablet Take 0.3 mg by mouth daily.     hydrOXYzine  (ATARAX ) 10 MG tablet Take 1  tablet (10 mg total) by mouth every 6 (six) hours as needed. 90 tablet 3   Multiple Vitamin (MULTIVITAMIN PO) Take 1 tablet by mouth daily in the afternoon.     nebivolol  (BYSTOLIC ) 10 MG tablet Take 1 tablet (10 mg total) by mouth daily. PATIENT MUST MAKE AN APPOINTMENT IN ORDER TO RECEIVE ADDITIONAL REFILLS, FIRST ATTEMPT 30 tablet 0   promethazine  (PHENERGAN ) 25 MG suppository Place 1 suppository (25 mg total) rectally every 6 (six) hours as needed for nausea. 12 suppository 3    sertraline  (ZOLOFT ) 50 MG tablet Take 1 tablet (50 mg total) by mouth in the morning and at bedtime. 180 tablet 1   SUMAtriptan  (IMITREX ) 100 MG tablet TAKE 1 TAB BY MOUTH EVERY 2 HOURS AS NEEDED FOR MIGRAINE MAY REPEAT IN 2 HOURS IF PERSISTS OR RECURS 18 tablet 11   No current facility-administered medications for this visit.    Medication Side Effects: None  Allergies: Allergies[1]  Past Medical History:  Diagnosis Date   Abnormal Pap smear 08/2005   Cancer [facility])    cervical   Diverticulosis    History of migraines    Hypertension    Kidney stones 2012   Menopause    Migraine    PFO (patent foramen ovale)    PONV (postoperative nausea and vomiting)    Seasonal allergies    Stroke [facility])     Family History  Problem Relation Age of Onset   Heart disease Mother        heart attack   Heart disease Father        heart attack   Hypertension Father    Breast cancer Neg Hx     Social History   Socioeconomic History   Marital status: Married    Spouse name: Not on file   Number of children: Not on file   Years of education: Not on file   Highest education level: Not on file  Occupational History   Not on file  Tobacco Use   Smoking status: Never   Smokeless tobacco: Never  Vaping Use   Vaping status: Never Used  Substance and Sexual Activity   Alcohol use: Yes    Alcohol/week: 1.0 standard drink of alcohol    Types: 1 Glasses of wine per week    Comment: rare / wine   Drug use: No   Sexual activity: Not on file  Other Topics Concern   Not on file  Social History Narrative   Not on file   Social Drivers of Health   Tobacco Use: Low Risk (07/24/2024)   Patient History    Smoking Tobacco Use: Never    Smokeless Tobacco Use: Never    Passive Exposure: Not on file  Financial Resource Strain: Not on file  Food Insecurity: Not on file  Transportation Needs: Not on file  Physical Activity: Not on file  Stress: Not on file  Social Connections: Not on file   Intimate Partner Violence: Not on file  Depression (EYV7-0): Not on file  Alcohol Screen: Not on file  Housing: Not on file  Utilities: Not on file  Health Literacy: Not on file    Past Medical History, Surgical history, Social history, and Family history were reviewed and updated as appropriate.   Please see review of systems for further details on the patient's review from today.   Objective:   Physical Exam:  There were no vitals taken for this visit.  Physical Exam Constitutional:  General: She is not in acute distress. Musculoskeletal:        General: No deformity.  Neurological:     Mental Status: She is alert and oriented to person, place, and time.     Coordination: Coordination normal.  Psychiatric:        Attention and Perception: Attention and perception normal. She does not perceive auditory or visual hallucinations.        Mood and Affect: Mood normal. Mood is not anxious or depressed. Affect is not labile, blunt, angry or inappropriate.        Speech: Speech normal.        Behavior: Behavior normal.        Thought Content: Thought content normal. Thought content is not paranoid or delusional. Thought content does not include homicidal or suicidal ideation. Thought content does not include homicidal or suicidal plan.        Cognition and Memory: Cognition and memory normal.        Judgment: Judgment normal.     Comments: Insight intact     Lab Review:     Component Value Date/Time   NA 141 10/31/2017 0937   K 3.8 10/31/2017 0937   CL 103 10/31/2017 0937   CO2 23 10/31/2017 0937   GLUCOSE 76 10/31/2017 0937   GLUCOSE 85 10/21/2015 0343   BUN 13 10/31/2017 0937   CREATININE 1.13 (H) 10/31/2017 0937   CALCIUM  9.4 10/31/2017 0937   PROT 6.7 10/31/2017 0937   ALBUMIN 4.3 10/31/2017 0937   AST 18 10/31/2017 0937   ALT 16 10/31/2017 0937   ALKPHOS 99 10/31/2017 0937   BILITOT 0.6 10/31/2017 0937   GFRNONAA 53 (L) 10/31/2017 0937   GFRAA 61  10/31/2017 0937       Component Value Date/Time   WBC 5.9 10/21/2015 0343   RBC 4.32 10/21/2015 0343   HGB 13.0 10/21/2015 0343   HCT 39.4 10/21/2015 0343   PLT 164 10/21/2015 0343   MCV 91.2 10/21/2015 0343   MCH 30.1 10/21/2015 0343   MCHC 33.0 10/21/2015 0343   RDW 12.8 10/21/2015 0343   LYMPHSABS 2.0 10/21/2015 0343   MONOABS 0.5 10/21/2015 0343   EOSABS 0.1 10/21/2015 0343   BASOSABS 0.0 10/21/2015 0343    No results found for: POCLITH, LITHIUM   No results found for: PHENYTOIN, PHENOBARB, VALPROATE, CBMZ   .res Assessment: Plan:    Plan:  PDMP reviewed  Zoloft  75mg  daily - script sent for 50mg  BID - may increase to 100mg  daily.  Hydroxyzine  10mg  daily as needed - none needed this visit.  RTC 6 months  15 minutes spent dedicated to the care of this patient on the date of this encounter to include pre-visit review of records, ordering of medication, post visit documentation, and face-to-face time with the patient discussing GAD. Discussed continuing current medication regimen.  Patient advised to contact office with any questions, adverse effects, or acute worsening in signs and symptoms.  Diagnoses and all orders for this visit:  Generalized anxiety disorder -     sertraline  (ZOLOFT ) 50 MG tablet; Take 1 tablet (50 mg total) by mouth in the morning and at bedtime.     Please see After Visit Summary for patient specific instructions.  Future Appointments  Date Time Provider Department Center  01/21/2025  8:00 AM Mccall Lomax Nattalie, NP CP-[facility] None    No orders of the defined types were placed in this encounter.     -------------------------------      [  1]  Allergies Allergen Reactions   Codeine Nausea Only, Other (See Comments) and Nausea And Vomiting    not well tolerated   Morphine Nausea Only and Other (See Comments)    not well tolerated    Morphine And Codeine Nausea Only and Other (See Comments)    not well  tolerated   Sulfa Antibiotics Itching, Hives and Rash

## 2024-07-27 ENCOUNTER — Other Ambulatory Visit: Payer: Self-pay | Admitting: Cardiovascular Disease

## 2024-08-10 ENCOUNTER — Other Ambulatory Visit: Payer: Self-pay | Admitting: Cardiovascular Disease

## 2025-01-21 ENCOUNTER — Telehealth: Admitting: Adult Health
